# Patient Record
Sex: Male | Born: 1960
Health system: Southern US, Community
[De-identification: ages and names within clinical notes are randomized; demographics above are authoritative.]

## PROBLEM LIST (undated history)

## (undated) DIAGNOSIS — R42 Dizziness and giddiness: Secondary | ICD-10-CM

## (undated) DIAGNOSIS — E559 Vitamin D deficiency, unspecified: Secondary | ICD-10-CM

## (undated) HISTORY — PX: CERVICAL DISC SURGERY: SHX588

---

## 1998-07-02 ENCOUNTER — Emergency Department (HOSPITAL_COMMUNITY): Admission: EM | Admit: 1998-07-02 | Discharge: 1998-07-02 | Payer: Self-pay | Admitting: Emergency Medicine

## 2004-06-01 ENCOUNTER — Emergency Department (HOSPITAL_COMMUNITY): Admission: EM | Admit: 2004-06-01 | Discharge: 2004-06-01 | Payer: Self-pay | Admitting: *Deleted

## 2004-06-08 ENCOUNTER — Ambulatory Visit (HOSPITAL_COMMUNITY): Admission: RE | Admit: 2004-06-08 | Discharge: 2004-06-08 | Payer: Self-pay | Admitting: Family Medicine

## 2011-02-28 ENCOUNTER — Emergency Department (HOSPITAL_COMMUNITY): Payer: 59

## 2011-02-28 ENCOUNTER — Inpatient Hospital Stay (INDEPENDENT_AMBULATORY_CARE_PROVIDER_SITE_OTHER)
Admission: RE | Admit: 2011-02-28 | Discharge: 2011-02-28 | Disposition: A | Discharge status: Home / Self Care | Payer: 59 | Source: Ambulatory Visit | Attending: Family Medicine | Admitting: Family Medicine

## 2011-02-28 ENCOUNTER — Observation Stay (HOSPITAL_COMMUNITY)
Admission: EM | Admit: 2011-02-28 | Discharge: 2011-03-01 | Disposition: A | Discharge status: Home / Self Care | Payer: 59 | Attending: Internal Medicine | Admitting: Internal Medicine

## 2011-02-28 DIAGNOSIS — R079 Chest pain, unspecified: Principal | ICD-10-CM | POA: Insufficient documentation

## 2011-02-28 DIAGNOSIS — F17200 Nicotine dependence, unspecified, uncomplicated: Secondary | ICD-10-CM | POA: Insufficient documentation

## 2011-02-28 DIAGNOSIS — Z8249 Family history of ischemic heart disease and other diseases of the circulatory system: Secondary | ICD-10-CM | POA: Insufficient documentation

## 2011-02-28 LAB — COMPREHENSIVE METABOLIC PANEL
ALT: 24 U/L (ref 0–53)
AST: 21 U/L (ref 0–37)
Albumin: 3.8 g/dL (ref 3.5–5.2)
Alkaline Phosphatase: 77 U/L (ref 39–117)
CO2: 27 mEq/L (ref 19–32)
GFR calc Af Amer: >60 mL/min (ref >60)
Glucose, Bld: 110 mg/dL — ABNORMAL HIGH (ref 70–99)
Potassium: 4.8 mEq/L (ref 3.5–5.1)
Total Bilirubin: 0.5 mg/dL (ref 0.3–1.2)
Total Protein: 7.1 g/dL (ref 6.0–8.3)

## 2011-02-28 LAB — CBC
Hemoglobin: 14.1 g/dL (ref 13.0–17.0)
Platelets: 309 10*3/uL (ref 150–400)
RDW: 12.9 % (ref 11.5–15.5)
WBC: 8.2 10*3/uL (ref 4.0–10.5)

## 2011-02-28 LAB — POCT CARDIAC MARKERS
CKMB, poc: 1.2 ng/mL (ref 1.0–8.0)
Myoglobin, poc: 38.7 ng/mL (ref 12–200)
Troponin i, poc: <0.05 ng/mL (ref 0.00–0.09)

## 2011-02-28 LAB — TROPONIN I: Troponin I: <0.01 ng/mL (ref 0.00–0.06)

## 2011-02-28 LAB — CK TOTAL AND CKMB (NOT AT ARMC)
Relative Index: INVALID (ref 0.0–2.5)
Total CK: 76 U/L (ref 7–232)

## 2011-02-28 LAB — DIFFERENTIAL
Basophils Absolute: 0.1 10*3/uL (ref 0.0–0.1)
Eosinophils Absolute: 0.3 10*3/uL (ref 0.0–0.7)
Lymphocytes Relative: 24 % (ref 12–46)
Lymphs Abs: 2 10*3/uL (ref 0.7–4.0)
Monocytes Absolute: 0.9 10*3/uL (ref 0.1–1.0)
Neutro Abs: 4.9 10*3/uL (ref 1.7–7.7)

## 2011-02-28 LAB — LIPASE, BLOOD: Lipase: 30 U/L (ref 11–59)

## 2011-02-28 LAB — D-DIMER, QUANTITATIVE: D-Dimer, Quant: 0.68 ug/mL-FEU — ABNORMAL HIGH (ref 0.00–0.48)

## 2011-03-01 ENCOUNTER — Encounter (HOSPITAL_COMMUNITY): Payer: Self-pay | Admitting: Radiology

## 2011-03-01 ENCOUNTER — Inpatient Hospital Stay (HOSPITAL_COMMUNITY)
Admission: EM | Admit: 2011-03-01 | Discharge: 2011-03-03 | DRG: 287 | Disposition: A | Discharge status: Home / Self Care | Payer: 59 | Attending: Cardiology | Admitting: Cardiology

## 2011-03-01 DIAGNOSIS — R0789 Other chest pain: Principal | ICD-10-CM | POA: Diagnosis present

## 2011-03-01 DIAGNOSIS — F17200 Nicotine dependence, unspecified, uncomplicated: Secondary | ICD-10-CM | POA: Diagnosis present

## 2011-03-01 DIAGNOSIS — F1910 Other psychoactive substance abuse, uncomplicated: Secondary | ICD-10-CM | POA: Diagnosis present

## 2011-03-01 DIAGNOSIS — Z7982 Long term (current) use of aspirin: Secondary | ICD-10-CM

## 2011-03-01 DIAGNOSIS — Z8249 Family history of ischemic heart disease and other diseases of the circulatory system: Secondary | ICD-10-CM

## 2011-03-01 DIAGNOSIS — R079 Chest pain, unspecified: Secondary | ICD-10-CM

## 2011-03-01 LAB — CARDIAC PANEL(CRET KIN+CKTOT+MB+TROPI)
CK, MB: 1.8 ng/mL (ref 0.3–4.0)
CK, MB: 2 ng/mL (ref 0.3–4.0)
Relative Index: INVALID (ref 0.0–2.5)
Relative Index: INVALID (ref 0.0–2.5)
Total CK: 76 U/L (ref 7–232)
Troponin I: <0.01 ng/mL (ref 0.00–0.06)

## 2011-03-01 LAB — DIFFERENTIAL
Basophils Absolute: 0.1 10*3/uL (ref 0.0–0.1)
Eosinophils Absolute: 0.3 10*3/uL (ref 0.0–0.7)
Lymphocytes Relative: 30 % (ref 12–46)

## 2011-03-01 LAB — POCT I-STAT, CHEM 8
BUN: 13 mg/dL (ref 6–23)
Chloride: 101 mEq/L (ref 96–112)
Creatinine, Ser: 1.1 mg/dL (ref 0.4–1.5)
Glucose, Bld: 117 mg/dL — ABNORMAL HIGH (ref 70–99)
HCT: 41 % (ref 39.0–52.0)
Hemoglobin: 13.9 g/dL (ref 13.0–17.0)
Potassium: 3.7 mEq/L (ref 3.5–5.1)
Sodium: 136 mEq/L (ref 135–145)

## 2011-03-01 LAB — COMPREHENSIVE METABOLIC PANEL
AST: 21 U/L (ref 0–37)
BUN: 12 mg/dL (ref 6–23)
CO2: 29 mEq/L (ref 19–32)
Creatinine, Ser: 0.98 mg/dL (ref 0.4–1.5)
GFR calc non Af Amer: >60 mL/min (ref >60)
Glucose, Bld: 102 mg/dL — ABNORMAL HIGH (ref 70–99)
Sodium: 138 mEq/L (ref 135–145)
Total Bilirubin: 0.3 mg/dL (ref 0.3–1.2)

## 2011-03-01 LAB — CBC
HCT: 39.9 % (ref 39.0–52.0)
HCT: 39.5 % (ref 39.0–52.0)
Hemoglobin: 13.1 g/dL (ref 13.0–17.0)
MCH: 29.2 pg (ref 26.0–34.0)
MCHC: 33.9 g/dL (ref 30.0–36.0)
MCV: 89.2 fL (ref 78.0–100.0)
RDW: 12.9 % (ref 11.5–15.5)

## 2011-03-01 LAB — POCT CARDIAC MARKERS

## 2011-03-01 LAB — RAPID URINE DRUG SCREEN, HOSP PERFORMED
Barbiturates: POSITIVE — AB
Cocaine: POSITIVE — AB

## 2011-03-01 LAB — LIPID PANEL
HDL: 47 mg/dL (ref >39)
Total CHOL/HDL Ratio: 3.3 RATIO
VLDL: 42 mg/dL — ABNORMAL HIGH (ref 0–40)

## 2011-03-01 MED ORDER — IOHEXOL 300 MG/ML  SOLN
100.0000 mL | Freq: Once | INTRAMUSCULAR | Status: AC | PRN
  Administered 2011-03-01: 100 mL via INTRAVENOUS

## 2011-03-02 DIAGNOSIS — I2510 Atherosclerotic heart disease of native coronary artery without angina pectoris: Secondary | ICD-10-CM

## 2011-03-02 LAB — CARDIAC PANEL(CRET KIN+CKTOT+MB+TROPI)
Relative Index: INVALID (ref 0.0–2.5)
Total CK: 57 U/L (ref 7–232)
Troponin I: <0.01 ng/mL (ref 0.00–0.06)

## 2011-03-02 LAB — CBC
HCT: 39.2 % (ref 39.0–52.0)
Hemoglobin: 12.9 g/dL — ABNORMAL LOW (ref 13.0–17.0)
RDW: 12.9 % (ref 11.5–15.5)
WBC: 8.5 10*3/uL (ref 4.0–10.5)

## 2011-03-02 LAB — PROTIME-INR: Prothrombin Time: 13 seconds (ref 11.6–15.2)

## 2011-03-02 LAB — HEPARIN LEVEL (UNFRACTIONATED): Heparin Unfractionated: 0.16 IU/mL — ABNORMAL LOW (ref 0.30–0.70)

## 2011-03-02 LAB — TROPONIN I: Troponin I: <0.01 ng/mL (ref 0.00–0.06)

## 2011-03-03 DIAGNOSIS — R079 Chest pain, unspecified: Secondary | ICD-10-CM

## 2011-03-05 NOTE — H&P (Signed)
NAMESTACY, DESHLER NO.:  [id number]  MEDICAL RECORD NO.:  [id number]           PATIENT TYPE:  E  LOCATION:  [facility]JO                         FACILITY:  [facility]  PHYSICIAN:  Wendi Snipes, MD DATE OF BIRTH:  07-23-1961  DATE OF ADMISSION:  03/01/2011 DATE OF DISCHARGE:                             HISTORY & PHYSICAL   PRIMARY DOCTOR:  Dr. Gerda Diss.  CHIEF COMPLAINT:  Chest pain.  HISTORY OF PRESENT ILLNESS:  This is a 50 year old white male with no past medical history here after short hospital stay for chest pain.  He was recently told he would have a stress test at [facility] on Monday after discharge.  He was ruled out and discharged but when he got home, he noticed that his chest pain worsened.  His chest pain was waxing and waning in nature and occurred at rest, and was described as chest pressure and associated with shortness of breath today.  His symptoms today were more severe than they were during his admission.  He otherwise denies any increased lower extremity edema, palpitations or previous cardiac workup.  PAST MEDICAL HISTORY:  Tobacco abuse.  ALLERGIES:  No known drug allergies.  MEDICATIONS:  None.  SOCIAL HISTORY:  Lives in [place] with his wife.  He is Surveyor, minerals for [employer].  He smokes approximately 1 pack per day.  FAMILY HISTORY:  His father had a myocardial infarction in his 58s.  REVIEW OF SYSTEMS:  All 14 systems were reviewed and were negative except as mentioned in detail in the HPI.  PHYSICAL EXAMINATION:  VITAL SIGNS:  Blood pressure is 92/79, his respiratory rate is 16, pulse is 78, satting 96% on room air. GENERAL:  He is a 50 year old white male appearing stated age, no acute distress. HEENT:  Moist mucous membranes.  Pupils are equal, round and reactive to light and accommodation.  Anicteric sclera. NECK:  No jugular venous distention.  No thyromegaly. CARDIOVASCULAR:  Regular rate and rhythm.  No murmurs,  rubs or gallops. LUNGS:  Clear to auscultation bilaterally. ABDOMEN:  Nontender, nondistended.  Positive bowel sounds.  No masses. EXTREMITIES:  No clubbing, cyanosis, edema. NEUROLOGIC:  Alert and oriented x3.  Cranial nerves II-XII grossly intact.  No focal neurologic deficits. SKIN:  Warm, dry, intact.  No rashes. PSYCH:  Mood and affect are appropriate.  RADIOLOGY:  CTA showed no PE and dependent atelectasis.  EKG showed normal sinus rhythm with a rate of 76 beats per minute with no ST or T- wave abnormalities.  Normal EKG.  LABORATORY FINDINGS:  White blood cell count 7.3, hematocrit is 39.5, potassium is 3.7, creatinine is 1.1.  Troponin is less than 0.05.  ASSESSMENT AND PLAN:  This is a 50 year old white male with a history of smoking and a family history of premature coronary artery disease here with chest pressure at rest. 1. Unstable angina.  We will start heparin drip for the concern that     this represents an acute coronary artery syndrome.  However, there     is no objective evidence of ischemia at this time.  We will start  him on a nitro drip as he is having a mild amount of chest pressure     at this time.  We will perform a noninvasive risk ratification in     the morning if he rules out for myocardial infarction. 2. Tobacco abuse.  Counseled the patient quit smoking immediately.     Wendi Snipes, MD     BHH/MEDQ  D:  03/02/2011  T:  03/02/2011  Job:  [id number]  Electronically Signed by Jim Desanctis MD on 03/05/2011 12:54:50 PM

## 2011-03-11 NOTE — Discharge Summary (Signed)
  NAMEADARSH, MUNDORF                ACCOUNT NO.:  [id number]  MEDICAL RECORD NO.:  [id number]           PATIENT TYPE:  E  LOCATION:  [facility]JO                         FACILITY:  [facility]  PHYSICIAN:  Peggye Pitt, M.D. DATE OF BIRTH:  1961/07/25  DATE OF ADMISSION:  03/01/2011 DATE OF DISCHARGE:  03/01/2011                              DISCHARGE SUMMARY   PRIMARY CARE PHYSICIAN:  Thora Lance, MD  DISCHARGE DIAGNOSIS:  Chest pain, ruled out for acute coronary syndrome, for outpatient stress test by [facility].  DISCHARGE MEDICATIONS:  None.  IMAGES AND PROCEDURES: 1. A chest x-ray on February 28, 2011, that showed acute cardiopulmonary     disease. 2. A CT chest that showed no evidence of PE with some mild dependent     atelectasis in the lungs.  HISTORY AND PHYSICAL:  For complete details, please see dictation by Dr. Susie Cassette on February 28, 2011, but in brief Mr. Broughton is a 50 year old Caucasian gentleman with no significant past medical history who presented to the emergency department for chest pain.  Because of this, we were asked to admit him for further evaluation and management.  HOSPITAL COURSE BY PROBLEM:  Chest pain.  Mr. Schnorr has now ruled out for a code coronary syndrome by the way of two sets of negative cardiac enzymes and EKGs.  He has very positive family history for coronary artery disease including a father who had his first heart attack at age 81.  Because of this, we believe that it is appropriate for him to be stressed.  I have spoken with [facility] and they do not have any room to stress him today, they will however see him in the outpatient setting for an outpatient stress test.  I have spoken with the card master and they will schedule this before the patient leaves the hospital.  VITALS ON DAY OF DISCHARGE:  Blood pressure 103/83, heart rate 73, respirations 16, sats of 100% on room air, and temperature of 97.6.     Peggye Pitt,  M.D.   ______________________________ Peggye Pitt, M.D.    EH/MEDQ  D:  03/01/2011  T:  03/02/2011  Job:  [id number]  cc:   Thora Lance, M.D.  Electronically Signed by Peggye Pitt M.D. on 03/11/2011 05:41:18 PM

## 2011-03-12 NOTE — Discharge Summary (Signed)
NAMEJOSHUWA, VECCHIO                ACCOUNT NO.:  [id number]  MEDICAL RECORD NO.:  [id number]           PATIENT TYPE:  I  LOCATION:  6526                         FACILITY:  [facility]  PHYSICIAN:  Mosella Kasa C. Rontavious Albright, MD, FACCDATE OF BIRTH:  Jun 18, 1961  DATE OF ADMISSION:  03/01/2011 DATE OF DISCHARGE:  03/03/2011                              DISCHARGE SUMMARY   PRIMARY CARE PHYSICIAN:  Dr. Gerda Diss.  DISCHARGE DIAGNOSES: 1. Noncardiac chest pain.     a.     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  Recommended      tobacco cessation, cessation of illicit substances, and follow up      with PCP with likely eventual GI workup. 2. Polysubstance abuse (ongoing tobacco abuse, UDS positive for     opiates, cocaine, and barbiturates).  SECONDARY DIAGNOSES:  Tobacco abuse as above.  ALLERGIES:  NKDA.  PROCEDURES: 1. EKG, March 01, 2011.  NSR, 76 bpm, no significant ST-T-wave changes. 2. CT angiography of the chest March 01, 2011:  No evidence of     pulmonary embolus.  Dependent atelectasis in the lungs. 3. Cardiac catheterization March 02, 2011.  Please see discharge     diagnoses 1(a).  HISTORY OF PRESENT ILLNESS:  Mr. Faulk is a 50 year old gentleman with the above-noted medical history which should be noted also includes a strong family history of premature CAD who presented with recurrent chest discomfort that was increasing in severity.  Eventual workup in ED revealed positive UDS and thus far cardiac enzymes negative as well as nonacute EKG.  However, due to increasing severity of symptoms and significant risk factors given ongoing tobacco abuse and positive family history, the patient eventually set up for diagnostic  cardiac catheterization.  HOSPITAL COURSE:  The patient was admitted and underwent procedures as described above.  He tolerated them well without any significant complications.  Due to the minimal nonobstructive CAD and normal LV systolic function, the patient was instructed to quit smoking, quit all illicit substances, follow up with the primary care provider with anticipation of likely GI workup in the future.  The patient was seen in the morning of March 03, 2011, on the day following his cardiac catheterization and deemed stable for discharge.  He was given his medication list essentially unchanged, followup instructions and postcath instructions.  All questions and concerns were addressed prior to leaving the hospital.  DISCHARGE LABORATORY FINDINGS:  WBC is 8.5, HGB 12.9, HCT 39.2, PLT count is 289.  WBC differential on admission was within normal limits. Protime 13.0, INR 0.96.  Sodium 136, potassium 3.7, chloride 101, bicarb 29, BUN 13, creatinine 1.1, glucose ranged from 102 to 117, hemoglobin A1c 5.9% with mean plasma glucose of 123.  The patient had 5 full sets of cardiac enzymes that were negative.  He also had a set  of point-of- care markers that was negative.  Total cholesterol 157, triglycerides 208, HDL 47, LDL 68, total cholesterol/HDL ratio 3.3.  Urine drug screen positive for barbiturates, cocaine and opiates.  FOLLOWUP PLAN AND APPOINTMENT:  Please see hospital course.  DISCHARGE MEDICATIONS: 1. Acetaminophen 325 mg 1-2 tablets p.o. q.4 h. p.r.n. 2. Enteric-coated aspirin 81 mg p.o. daily.  Duration of discharge encounter including physician time was 31 minutes.     Jarrett Ables, PAC   ______________________________ Jesse Sans. Daleen Squibb, MD, [facility]    MS/MEDQ  D:  03/03/2011  T:  03/04/2011  Job:  [id number]  cc:   Dr. Gerda Diss  Electronically Signed by Jarrett Ables PAC on 03/04/2011 12:59:41 PM Electronically Signed by Valera Castle MD [facility] on 03/12/2011  09:17:39 AM

## 2011-03-25 NOTE — Procedures (Signed)
Louis Griffin, Louis Griffin                ACCOUNT NO.:  [id number]  MEDICAL RECORD NO.:  [id number]          PATIENT TYPE:  LOCATION:                                 FACILITY:  PHYSICIAN:  Bevelyn Buckles. Sophonie Goforth, MDDATE OF BIRTH:  1961-04-29  DATE OF PROCEDURE:  03/02/2011 DATE OF DISCHARGE:                           CARDIAC CATHETERIZATION   DATE OF PROCEDURE:  March 02, 2011.  PRIMARY CARE PHYSICIAN:  Scott A. Gerda Diss, MD.  Louis Griffin is a 50 year old male with a history of tobacco use and a strong family history of coronary artery disease, denies any history of hypertension or hyperlipidemia.  He is admitted with chest pressure. Workup revealed a positive urine drug screen for cocaine.  His cardiac enzymes and EKG were normal.  Given the concerning nature of his pain, he was referred for cardiac catheterization.  PROCEDURES PERFORMED: 1. Selective coronary angiography. 2. Left heart cath. 3. Left ventriculogram.  DESCRIPTION OF PROCEDURE:  The risk and indication were explained. Consent was signed and placed on the chart.  The right Allen's test was abnormal, so we then checked an Allen's test on the left wrist, this was normal.  The left wrist area was then prepped and draped in routine sterile fashion, anesthetized with 1% local lidocaine.  A 5-French arterial sheath was placed using a modified Seldinger technique. Standard catheters including a JL-3.5, JR-4, and angled pigtail were used.  All catheter exchanges were made over wire.  There were no apparent complication.  He did receive 3 mg of intra-arterial verapamil and 4500 units of systemic heparin.  Central aortic pressure was 106/79 with a mean of 93.  LV pressure 101/10 with EDP of 12.  There is no aortic stenosis.  Left main was normal.  LAD was a long vessel coursing through the apex.  It gave off a single diagonal.  In the mid LAD, there was long section of myocardial bridging.  There was evidence of systolic  compression but normal diastolic flow.  Remainder of the LAD was normal.  Left circumflex gave off small to moderate OM-1, a large OM-2, and a small posterolateral.  There is a 30% lesion in the ostial circumflex, a 30 to 40% lesion in the mid AV groove circumflex, and a 20% lesion in the OM-2.  Right coronary artery was a dominant vessel, it gave off an RV branch, a large acute marginal which fed the PDA territory several small posterolaterals.  It was angiographically normal.  Left ventriculogram done in the RAO position showed an EF of 55%. Initially, I thought there may be a little bit of apical hypokinesis, but I think this was likely a PVC.  Overall, wall motion seemed to be normal.  There is no significant mitral regurgitation.  ASSESSMENT: 1. Minimal nonobstructive coronary artery disease. 2. 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 3. Normal left ventricular function.  Plan will be for medical therapy.  I stressed the need for risk factor reduction including avoidance of tobacco use and illicit substances.  If this pain continues, we would recommend a  GI workup.     Bevelyn Buckles. Duvall Comes, MD     DRB/MEDQ  D:  03/02/2011  T:  03/03/2011  Job:  [id number]  cc:   Lorin Picket A. Gerda Diss, MD  Electronically Signed by Arvilla Meres MD on 03/25/2011 06:45:14 PM

## 2011-04-03 NOTE — H&P (Signed)
Louis Griffin, Louis Griffin                ACCOUNT NO.:  [id number]  MEDICAL RECORD NO.:  [id number]           PATIENT TYPE:  E  LOCATION:  [facility]                         FACILITY:  [facility]  PHYSICIAN:  Richarda Overlie, MD       DATE OF BIRTH:  1961/04/30  DATE OF ADMISSION:  02/28/2011 DATE OF DISCHARGE:                             HISTORY & PHYSICAL   PRIMARY CARE PHYSICIAN:  Thora Lance, MD; however, the patient states that Dr. Kirby Funk is not his primary care physician and he has seen him only for acute issues.  Therefore, the patient is being admitted under [facility] team 3.  CHIEF COMPLAINT:  Chest pain.  SUBJECTIVE:  This is a 50 year old male who presents to the ED with a chief complaint of chest pain that started around 11:30 this morning. The chest pain is described as chest tightness, substernal in location, sometimes described as chest tightness, and occasionally described as being a burning sensation, each episode lasting 10-15 minutes, not particularly related to exertion.  In fact, the patient states that it may have improved with ambulation.  It is not particularly pleuritic and is not worse when he takes in a deep breath.  He has never experienced this kind of pain before.  He does not have any history of coronary artery disease.  He denies any fever, chills, and rigors in the last few days.  He has an occasional cough which is worse when he is in a recumbent position and worse at night and in the morning.  However, the patient denies any orthopnea, paroxysmal nocturnal dyspnea, or dependent edema.  He has no recent history of travel.  He denies any nausea, vomiting, abdominal pain, or any other symptoms at this time.  REVIEW OF SYSTEMS:  Complete review of systems was done as documented in HPI.  PAST MEDICAL HISTORY:  None.  FAMILY HISTORY:  Father had heart attack at the age of 91-44.  He also has a history of CABG and stent placement.  He also has dyslipidemia  and hypertension.  He is 50 and alive and otherwise doing fine. Hypertension also runs on his mother's side of the family but his mother is healthy.  One brother has coronary artery disease and one systems who is 49 years old who is healthy.  SOCIAL HISTORY:  The patient smokes a pack a day.  Drinks alcohol at least twice a week.  Works as Teacher, English as a foreign language.  He has three children who are healthy.  ALLERGIES:  No known drug allergies.  HOME MEDICATIONS:  Currently none.  REVIEW OF SYSTEMS:  Complete review of systems was done as documented in HPI.  PHYSICAL EXAMINATION:  VITAL SIGNS:  Blood pressure 130/84, pulse 76, respirations 18, temperature 98.1. GENERAL:  Comfortable currently in no acute cardiopulmonary distress. HEENT:  Normocephalic, atraumatic.  Pupils are equal and reactive. Extraocular movements intact. NECK:  Supple.  No JVD. LUNGS:  Clear to auscultation bilaterally.  No wheezes or crackles or rhonchi. ABDOMEN:  Soft, nontender, nondistended. EXTREMITIES:  Without cyanosis, clubbing, or edema. NEUROLOGIC:  Cranial nerves II through XII appear to  be grossly intact. SKIN:  Without any skin rashes. PSYCHIATRIC:  Appropriate mood and affect.  LABORATORY DATA:  EKG shows normal sinus rhythm without any ST-T segment changes at a rate of 75 beats per minute.  X-ray shows no acute cardiopulmonary disease.  Lipase is 30.  Sodium 135, potassium 4.8, chloride 101, bicarb 27, glucose 110, BUN 13, creatinine 0.92, calcium 9.5, total protein 7.1, albumin 3.8.  AST 21, ALT 24, alk phos 77, T- Bili 0.5.  WBC 8.2, hemoglobin 14.1, hematocrit 42.2, platelet count of 309,000.  ASSESSMENT AND PLAN: 1. Chest pain was fairly atypical features, doubt this is anginal     pain. 2. Rule out pulmonary embolism. 3. Nicotine dependence. 4. Strong family history of coronary artery disease.  PLAN:  The patient will be admitted to this telemetry floor.  I have spoken with [facility] and they are in agreement that the patient would warrant a stress test in the morning.  We will schedule him for a Lexiscan Myoview in the morning.  We will start him on aspirin, beta-blocker, and nitro paste overnight.  We will cycle his cardiac enzymes, obtain serial EKGs.  If the patient's stress test is negative, then he may be discharged home in the morning.  We will also obtain a stat D-dimer.  If elevated, we will obtain a CT angio of the chest to rule out a PE.  He is a full code.     Richarda Overlie, MD     NA/MEDQ  D:  02/28/2011  T:  02/28/2011  Job:  [id number]  Electronically Signed by Richarda Overlie MD on 04/03/2011 08:32:24 PM

## 2013-03-02 ENCOUNTER — Emergency Department (INDEPENDENT_AMBULATORY_CARE_PROVIDER_SITE_OTHER)
Admission: EM | Admit: 2013-03-02 | Discharge: 2013-03-02 | Disposition: A | Discharge status: Home / Self Care | Payer: 59 | Source: Home / Self Care | Attending: Emergency Medicine | Admitting: Emergency Medicine

## 2013-03-02 ENCOUNTER — Encounter (HOSPITAL_COMMUNITY): Payer: Self-pay | Admitting: Emergency Medicine

## 2013-03-02 MED ORDER — CEPHALEXIN 500 MG PO CAPS: 500.0000 mg | ORAL_CAPSULE | Freq: Four times a day (QID) | ORAL | Status: DC

## 2013-03-02 NOTE — ED Provider Notes (Signed)
History     CSN: [id number]  Arrival date & time 03/02/13  1323   First MD Initiated Contact with Patient 03/02/13 1403      Chief Complaint  Patient presents with  . Wrist Pain    (Consider location/radiation/quality/duration/timing/severity/associated sxs/prior treatment) HPI Comments: Pt woke up yesterday morning with swelling and pain in R wrist.  Thinks something must have bitten him. Initially was small raised area, now swelling has spread  Patient is a 52 y.o. male presenting with wrist pain. The history is provided by the patient.  Wrist Pain This is a new problem. The current episode started yesterday. The problem occurs constantly. The problem has been gradually worsening. Exacerbated by: touching swollen area. Nothing relieves the symptoms. Treatments tried: transient improvement on ibuprofen.    History reviewed. No pertinent past medical history.  History reviewed. No pertinent past surgical history.  History reviewed. No pertinent family history.  History  Substance Use Topics  . Smoking status: Current Every Day Smoker -- 0.50 packs/day    Types: Cigarettes  . Smokeless tobacco: Not on file  . Alcohol Use: Yes      Review of Systems  Constitutional: Negative for fever and chills.  Musculoskeletal:       R wrist swelling and redness  Skin: Positive for color change. Negative for wound.    Allergies  Review of patient's allergies indicates no known allergies.  Home Medications   Current Outpatient Rx  Name  Route  Sig  Dispense  Refill  . cephALEXin (KEFLEX) 500 MG capsule   Oral   Take 1 capsule (500 mg total) by mouth 4 (four) times daily.   28 capsule   0     BP 138/89  Pulse 89  Temp(Src) 98.1 F (36.7 C) (Oral)  Resp 16  SpO2 97%  Physical Exam  Constitutional: He appears well-developed and well-nourished. No distress.  Musculoskeletal:       Right wrist: He exhibits tenderness and swelling. He exhibits normal range of motion.      Hands: Skin: Skin is warm, dry and intact. No rash noted. There is erythema.  No obvious bite/puncture mark on skin from bite. Warmth and tenderness to palp R wrist. See MSK exam.     ED Course  Procedures (including critical care time)  Labs Reviewed - No data to display No results found.   1. Insect bite of wrist with local reaction, left, initial encounter       MDM  Local reaction vs cellulitis.  No proof of insect bite but sx c/w bite reaction. Will tx as if insect bite with reaction at risk for secondary infection.         Cathlyn Parsons, NP 03/02/13 1415

## 2013-03-02 NOTE — ED Notes (Signed)
Pt is here for swollen right wrist onset yest am Sx include: redness, pain w/activity and swelling Denies: inj/trauma, f/v/n/d Believes he might have gotten bit by an insect??? Took x3 200mg  of ibuprofen this am  He is alert and oriented w/no signs of acute distress.

## 2013-03-02 NOTE — ED Provider Notes (Signed)
Medical screening examination/treatment/procedure(s) were performed by non-physician practitioner and as supervising physician I was immediately available for consultation/collaboration.  Sharyah Bostwick, M.D.  Samie Barclift C Drianna Chandran, MD 03/02/13 2012 

## 2013-03-02 NOTE — ED Notes (Signed)
Pt has been discharged and signed paper due to Epic down

## 2015-05-16 ENCOUNTER — Emergency Department (INDEPENDENT_AMBULATORY_CARE_PROVIDER_SITE_OTHER)
Admission: EM | Admit: 2015-05-16 | Discharge: 2015-05-16 | Disposition: A | Discharge status: Home / Self Care | Payer: 59 | Source: Home / Self Care | Attending: Family Medicine | Admitting: Family Medicine

## 2015-05-16 ENCOUNTER — Encounter (HOSPITAL_COMMUNITY): Payer: Self-pay | Admitting: Emergency Medicine

## 2015-05-16 DIAGNOSIS — H5789 Other specified disorders of eye and adnexa: Secondary | ICD-10-CM

## 2015-05-16 DIAGNOSIS — H578 Other specified disorders of eye and adnexa: Secondary | ICD-10-CM

## 2015-05-16 MED ORDER — TETRACAINE HCL 0.5 % OP SOLN
OPHTHALMIC | Status: AC
  Filled 2015-05-16: qty 2

## 2015-05-16 MED ORDER — ERYTHROMYCIN 5 MG/GM OP OINT: TOPICAL_OINTMENT | OPHTHALMIC | Status: DC

## 2015-05-16 NOTE — ED Provider Notes (Signed)
Louis Griffin is a 54 y.o. male who presents to Urgent Care today for left eye irritation. Patient has irritation itching and a foreign body sensation in his left eye. This is associated with eye redness. Symptoms started today and are stable. He denies any significant blurriness. No fevers or chills. He cannot recall any specific incident a foreign body exposure. He's tried irrigating the eye which did not help much.   History reviewed. No pertinent past medical history. History reviewed. No pertinent past surgical history. History  Substance Use Topics  . Smoking status: Current Every Day Smoker -- 0.50 packs/day    Types: Cigarettes  . Smokeless tobacco: Not on file  . Alcohol Use: Yes   ROS as above Medications: No current facility-administered medications for this encounter.   Current Outpatient Prescriptions  Medication Sig Dispense Refill  . cephALEXin (KEFLEX) 500 MG capsule Take 1 capsule (500 mg total) by mouth 4 (four) times daily. 28 capsule 0   No Known Allergies   Exam:  BP 125/80 mmHg  Pulse 84  Temp(Src) 97.3 F (36.3 C) (Oral)  Resp 16  SpO2 95% Gen: Well NAD HEENT: EOMI,  MMM conjunctiva injection on the left normal on the right. PERRLA bilaterally. No fluorescein uptake on the cornea. The Tono-Pen is nonfunctioning this evening.  No foreign body visible with detailed exam. Lungs: Normal work of breathing. CTABL Heart: RRR no MRG Abd: NABS, Soft. Nondistended, Nontender Exts: Brisk capillary refill, warm and well perfused.   Visual Acuity:  Right Eye Distance: 20/20-2 Left Eye Distance: 20/20-2 Bilateral Distance: 20/25   No results found for this or any previous visit (from the past 24 hour(s)). No results found.  Assessment and Plan: 54 y.o. male with left eye irritation. I suspect he has allergic conjunctivitis. Treat with Zaditor eyedrops and Zyrtec. Use erythromycin ointment and follow-up with ophthalmology.  Discussed warning signs or  symptoms. Please see discharge instructions. Patient expresses understanding.     Gregor Hams, MD 05/16/15 2032

## 2015-05-16 NOTE — Discharge Instructions (Signed)
Thank you for coming in today. Use over-the-counter Zaditor eyedrops (Ketotifen) Use over-the-counter Zyrtec (cetirizine)  Use Systane artificial tears as needed Use the eye ointment.  Follow up with Dr. Katy Fitch.  Return as needed.   Allergic Conjunctivitis The conjunctiva is a thin membrane that covers the visible white part of the eyeball and the underside of the eyelids. This membrane protects and lubricates the eye. The membrane has small blood vessels running through it that can normally be seen. When the conjunctiva becomes inflamed, the condition is called conjunctivitis. In response to the inflammation, the conjunctival blood vessels become swollen. The swelling results in redness in the normally white part of the eye. The blood vessels of this membrane also react when a person has allergies and is then called allergic conjunctivitis. This condition usually lasts for as long as the allergy persists. Allergic conjunctivitis cannot be passed to another person (non-contagious). The likelihood of bacterial infection is great and the cause is not likely due to allergies if the inflamed eye has:  A sticky discharge.  Discharge or sticking together of the lids in the morning.  Scaling or flaking of the eyelids where the eyelashes come out.  Red swollen eyelids. CAUSES   Viruses.  Irritants such as foreign bodies.  Chemicals.  General allergic reactions.  Inflammation or serious diseases in the inside or the outside of the eye or the orbit (the boney cavity in which the eye sits) can cause a "red eye." SYMPTOMS   Eye redness.  Tearing.  Itchy eyes.  Burning feeling in the eyes.  Clear drainage from the eye.  Allergic reaction due to pollens or ragweed sensitivity. Seasonal allergic conjunctivitis is frequent in the spring when pollens are in the air and in the fall. DIAGNOSIS  This condition, in its many forms, is usually diagnosed based on the history and an  ophthalmological exam. It usually involves both eyes. If your eyes react at the same time every year, allergies may be the cause. While most "red eyes" are due to allergy or an infection, the role of an eye (ophthalmological) exam is important. The exam can rule out serious diseases of the eye or orbit. TREATMENT   Non-antibiotic eye drops, ointments, or medications by mouth may be prescribed if the ophthalmologist is sure the conjunctivitis is due to allergies alone.  Over-the-counter drops and ointments for allergic symptoms should be used only after other causes of conjunctivitis have been ruled out, or as your caregiver suggests. Medications by mouth are often prescribed if other allergy-related symptoms are present. If the ophthalmologist is sure that the conjunctivitis is due to allergies alone, treatment is normally limited to drops or ointments to reduce itching and burning. HOME CARE INSTRUCTIONS   Wash hands before and after applying drops or ointments, or touching the inflamed eye(s) or eyelids.  Do not let the eye dropper tip or ointment tube touch the eyelid when putting medicine in your eye.  Stop using your soft contact lenses and throw them away. Use a new pair of lenses when recovery is complete. You should run through sterilizing cycles at least three times before use after complete recovery if the old soft contact lenses are to be used. Hard contact lenses should be stopped. They need to be thoroughly sterilized before use after recovery.  Itching and burning eyes due to allergies is often relieved by using a cool cloth applied to closed eye(s). SEEK MEDICAL CARE IF:   Your problems do not go away after  two or three days of treatment.  Your lids are sticky (especially in the morning when you wake up) or stick together.  Discharge develops. Antibiotics may be needed either as drops, ointment, or by mouth.  You have extreme light sensitivity.  An oral temperature above 102  F (38.9 C) develops.  Pain in or around the eye or any other visual symptom develops. MAKE SURE YOU:   Understand these instructions.  Will watch your condition.  Will get help right away if you are not doing well or get worse. Document Released: 03/05/2003 Document Revised: 03/06/2012 Document Reviewed: 01/29/2008 [facility] Patient Information 2015 [place], [place]. This information is not intended to replace advice given to you by your health care provider. Make sure you discuss any questions you have with your health care provider.

## 2015-05-16 NOTE — ED Notes (Signed)
C/o left eye pain/irritation onset earlier today; denies inj/trauma Sx also include redness, watery and blurry vision of the left eye Alert, no signs of acute distress.

## 2018-06-09 DIAGNOSIS — R509 Fever, unspecified: Secondary | ICD-10-CM | POA: Diagnosis not present

## 2018-09-04 DIAGNOSIS — N509 Disorder of male genital organs, unspecified: Secondary | ICD-10-CM | POA: Diagnosis not present

## 2018-10-03 ENCOUNTER — Other Ambulatory Visit: Payer: Self-pay | Admitting: Urology

## 2018-10-03 DIAGNOSIS — Z125 Encounter for screening for malignant neoplasm of prostate: Secondary | ICD-10-CM

## 2018-10-03 DIAGNOSIS — I861 Scrotal varices: Secondary | ICD-10-CM | POA: Diagnosis not present

## 2018-10-03 DIAGNOSIS — N401 Enlarged prostate with lower urinary tract symptoms: Secondary | ICD-10-CM | POA: Diagnosis not present

## 2018-10-03 DIAGNOSIS — R3912 Poor urinary stream: Secondary | ICD-10-CM | POA: Diagnosis not present

## 2018-10-11 ENCOUNTER — Other Ambulatory Visit: Payer: Self-pay | Admitting: Urology

## 2018-10-11 DIAGNOSIS — I861 Scrotal varices: Secondary | ICD-10-CM

## 2018-10-13 ENCOUNTER — Ambulatory Visit
Admission: RE | Admit: 2018-10-13 | Discharge: 2018-10-13 | Disposition: A | Discharge status: Home / Self Care | Payer: 59 | Source: Ambulatory Visit | Attending: Urology | Admitting: Urology

## 2018-10-13 DIAGNOSIS — I861 Scrotal varices: Secondary | ICD-10-CM

## 2018-10-13 DIAGNOSIS — N503 Cyst of epididymis: Secondary | ICD-10-CM | POA: Diagnosis not present

## 2018-10-20 DIAGNOSIS — Z23 Encounter for immunization: Secondary | ICD-10-CM | POA: Diagnosis not present

## 2018-10-20 DIAGNOSIS — Z1321 Encounter for screening for nutritional disorder: Secondary | ICD-10-CM | POA: Diagnosis not present

## 2018-10-20 DIAGNOSIS — D696 Thrombocytopenia, unspecified: Secondary | ICD-10-CM | POA: Diagnosis not present

## 2018-10-20 DIAGNOSIS — Z131 Encounter for screening for diabetes mellitus: Secondary | ICD-10-CM | POA: Diagnosis not present

## 2018-10-20 DIAGNOSIS — R062 Wheezing: Secondary | ICD-10-CM | POA: Diagnosis not present

## 2018-10-20 DIAGNOSIS — Z0001 Encounter for general adult medical examination with abnormal findings: Secondary | ICD-10-CM | POA: Diagnosis not present

## 2018-10-20 DIAGNOSIS — F17200 Nicotine dependence, unspecified, uncomplicated: Secondary | ICD-10-CM | POA: Diagnosis not present

## 2019-01-05 DIAGNOSIS — K921 Melena: Secondary | ICD-10-CM | POA: Diagnosis not present

## 2019-01-17 DIAGNOSIS — D123 Benign neoplasm of transverse colon: Secondary | ICD-10-CM | POA: Diagnosis not present

## 2019-01-17 DIAGNOSIS — K621 Rectal polyp: Secondary | ICD-10-CM | POA: Diagnosis not present

## 2019-01-17 DIAGNOSIS — K921 Melena: Secondary | ICD-10-CM | POA: Diagnosis not present

## 2019-02-23 DIAGNOSIS — E785 Hyperlipidemia, unspecified: Secondary | ICD-10-CM | POA: Diagnosis not present

## 2019-02-23 DIAGNOSIS — E663 Overweight: Secondary | ICD-10-CM | POA: Diagnosis not present

## 2019-02-23 DIAGNOSIS — E559 Vitamin D deficiency, unspecified: Secondary | ICD-10-CM | POA: Diagnosis not present

## 2019-03-02 DIAGNOSIS — E559 Vitamin D deficiency, unspecified: Secondary | ICD-10-CM | POA: Diagnosis not present

## 2019-03-02 DIAGNOSIS — E782 Mixed hyperlipidemia: Secondary | ICD-10-CM | POA: Diagnosis not present

## 2019-05-06 IMAGING — US US SCROTUM W/ DOPPLER COMPLETE
1 series · 14 of 25 positions shown · non-contrast
Comparison: None.

CLINICAL DATA: Bilateral testicular swelling.

EXAM:
SCROTAL ULTRASOUND
DOPPLER ULTRASOUND OF THE TESTICLES
TECHNIQUE: Complete ultrasound examination of the testicles, epididymis, and
other scrotal structures was performed. Color and spectral Doppler
ultrasound were also utilized to evaluate blood flow to the
testicles.

[Series 1: us scrotum w/ doppler complete · 0.07mm/px · 14 of 45 slices shown]
[im 1/45]
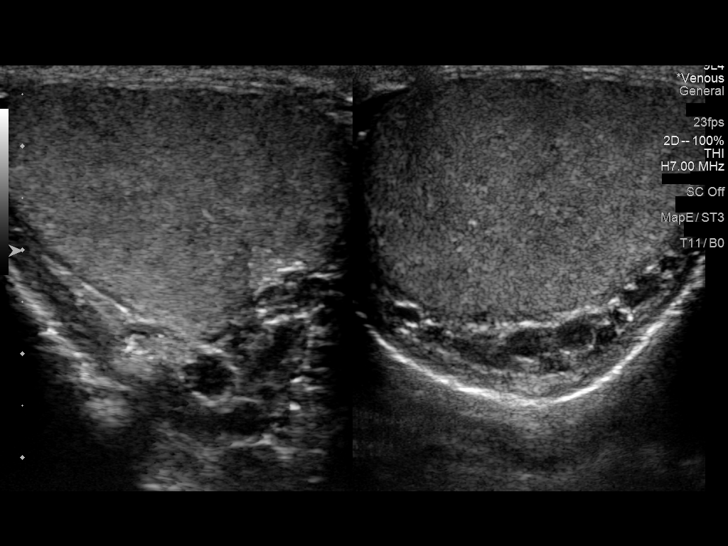
[im 4/45]
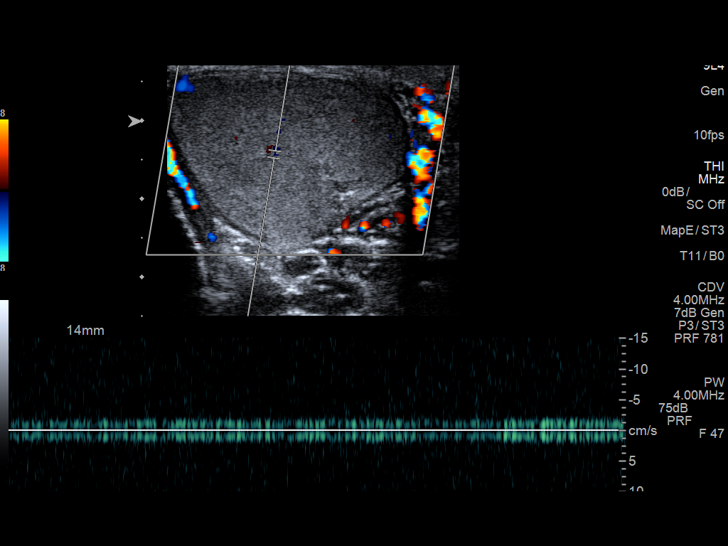
[im 8/45]
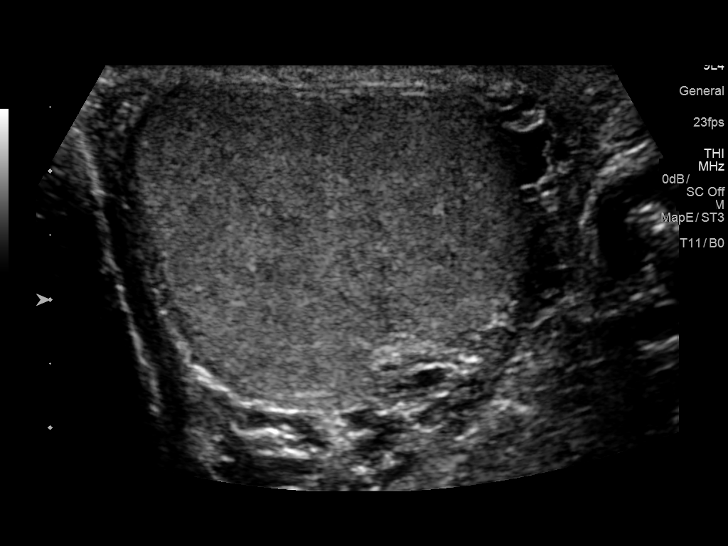
[im 12/45]
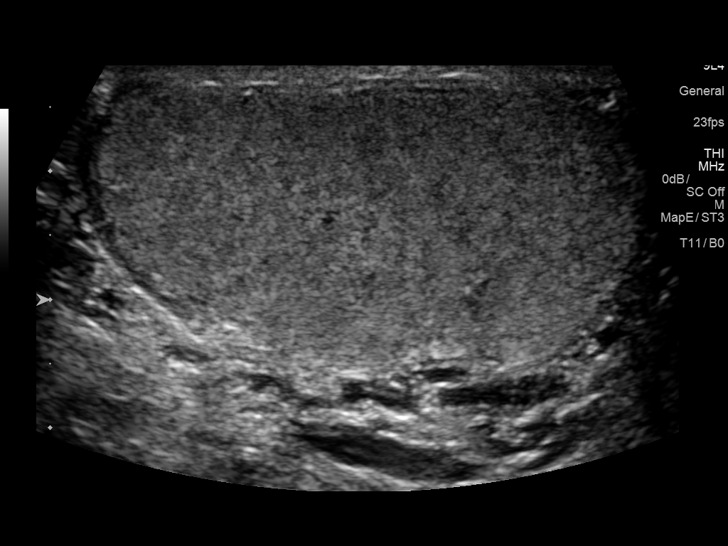
[im 15/45]
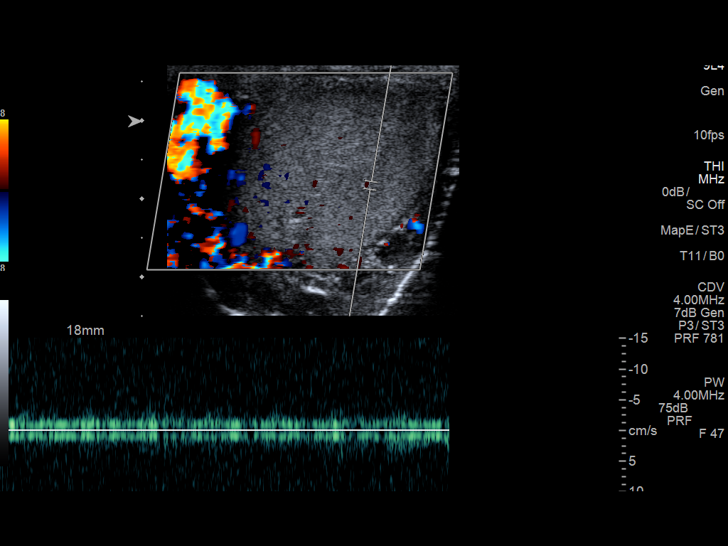
[im 17/45]
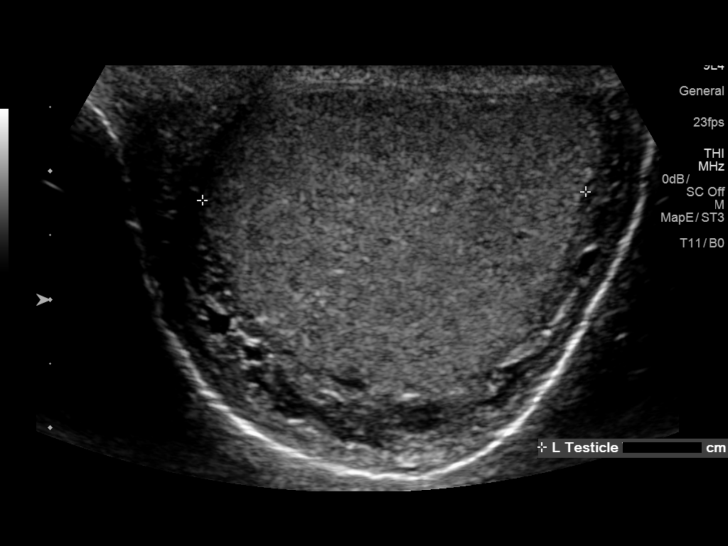
[im 21/45]
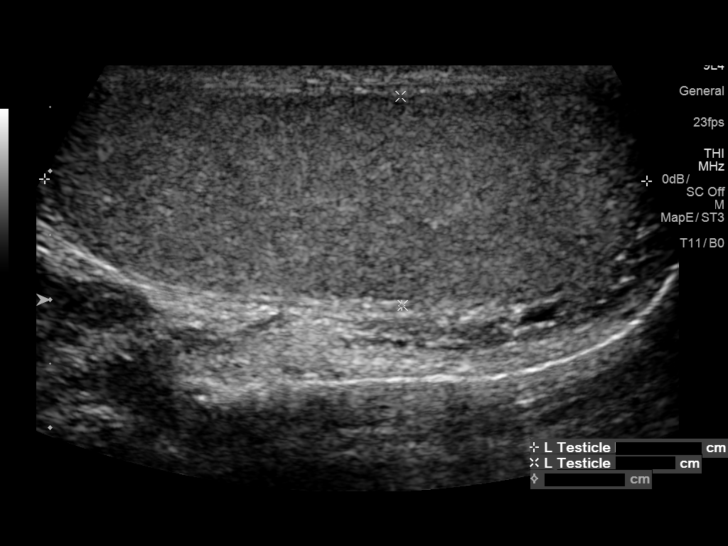
[im 24/45]
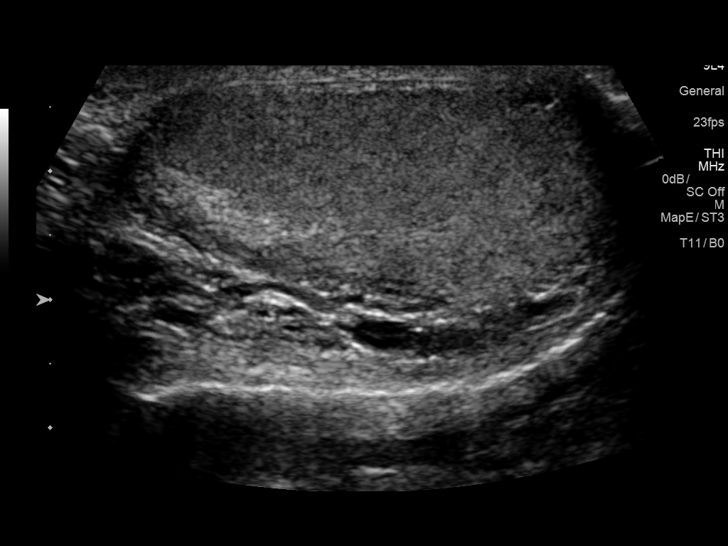
[im 28/45]
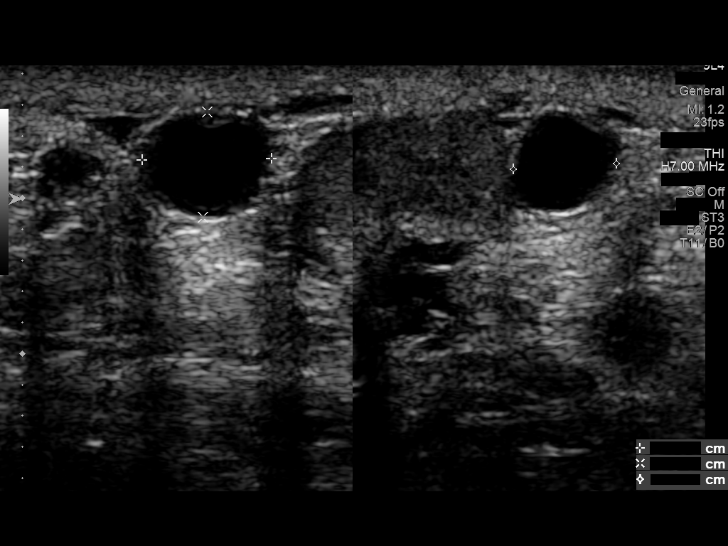
[im 30/45]
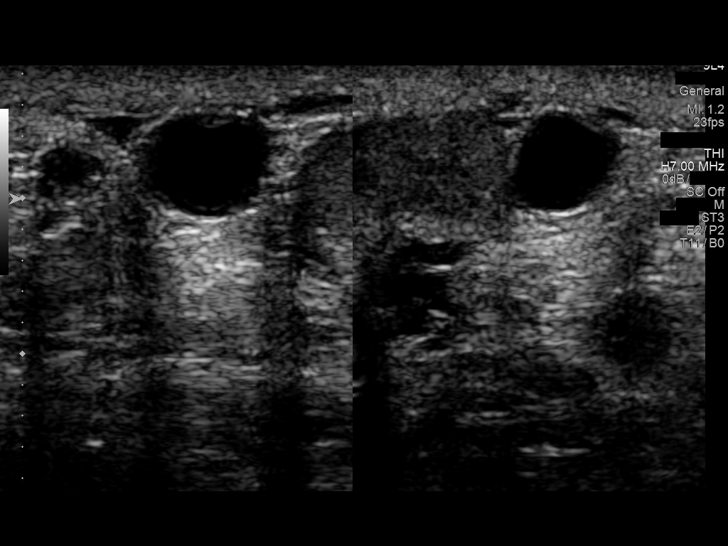
[im 34/45]
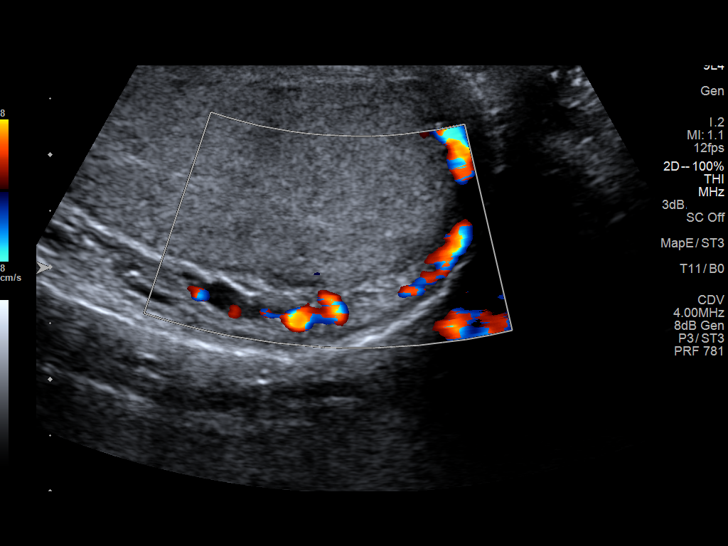
[im 37/45]
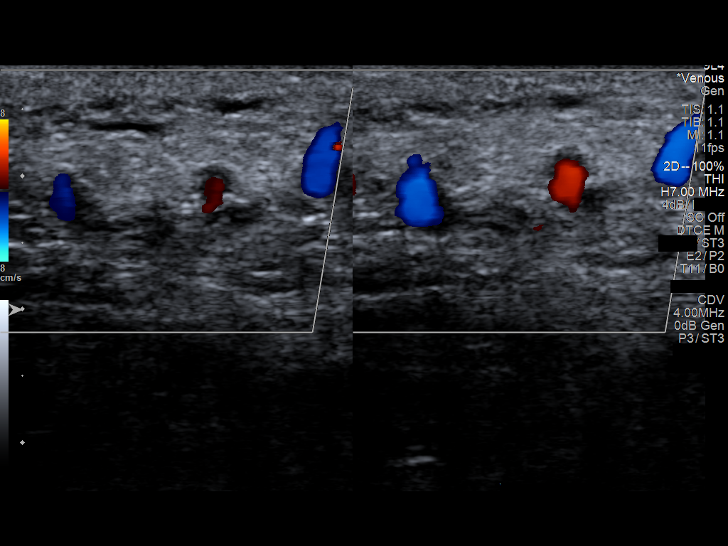
[im 41/45]
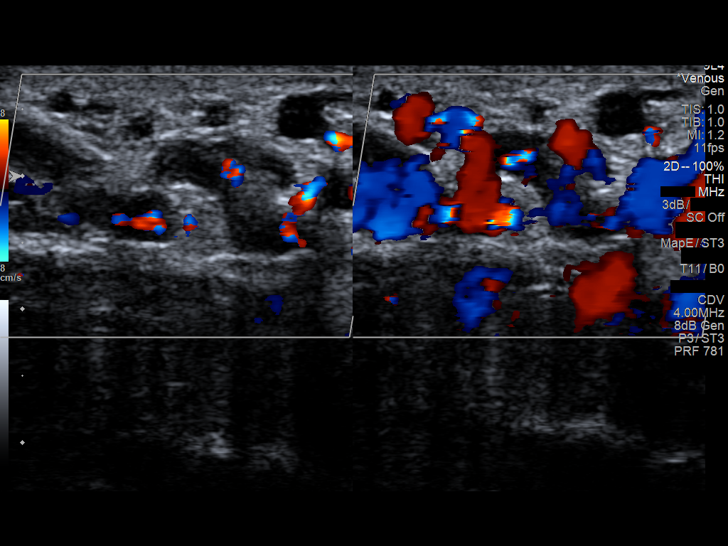
[im 45/45]
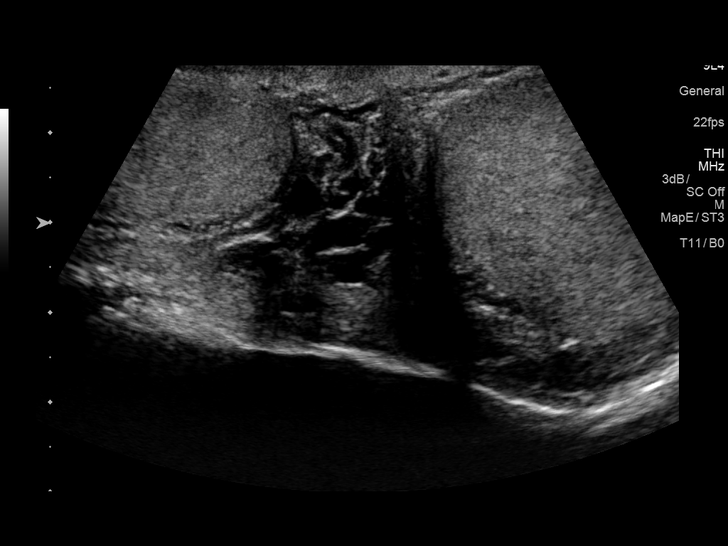

[14 of 25 positions shown; findings below may reference images not displayed]

FINDINGS: Right testicle

Measurements: 4.3 x 2.2 x 3.0 cm. No mass or microlithiasis
visualized.

Left testicle

Measurements: 4.7 x 1.6 x 3.0 cm. No mass or microlithiasis
visualized.

Right epididymis: Normal in size and appearance. 8 mm
benign-appearing cysts incidentally noted.

Left epididymis:  Normal in size and appearance.

Hydrocele:  None visualized.

Varicocele:  None visualized.

Pulsed Doppler interrogation of both testes demonstrates normal low
resistance arterial and venous waveforms bilaterally.
IMPRESSION: Normal testicular ultrasound.

## 2019-09-26 ENCOUNTER — Encounter (HOSPITAL_COMMUNITY): Payer: Self-pay

## 2019-09-26 ENCOUNTER — Ambulatory Visit (HOSPITAL_COMMUNITY)
Admission: EM | Admit: 2019-09-26 | Discharge: 2019-09-26 | Disposition: A | Discharge status: Home / Self Care | Payer: 59 | Attending: Emergency Medicine | Admitting: Emergency Medicine

## 2019-09-26 ENCOUNTER — Other Ambulatory Visit: Payer: Self-pay

## 2019-09-26 DIAGNOSIS — R030 Elevated blood-pressure reading, without diagnosis of hypertension: Secondary | ICD-10-CM

## 2019-09-26 DIAGNOSIS — M25551 Pain in right hip: Secondary | ICD-10-CM | POA: Diagnosis not present

## 2019-09-26 MED ORDER — IBUPROFEN 800 MG PO TABS: 800.0000 mg | ORAL_TABLET | Freq: Three times a day (TID) | ORAL | 0 refills | Status: DC | PRN

## 2019-09-26 MED ORDER — CYCLOBENZAPRINE HCL 10 MG PO TABS: 10.0000 mg | ORAL_TABLET | Freq: Two times a day (BID) | ORAL | 0 refills | Status: DC | PRN

## 2019-09-26 NOTE — ED Provider Notes (Signed)
[place]    CSN: [id number] Arrival date & time: 09/26/19  1004      History   Chief Complaint Chief Complaint  Patient presents with  . Hip Pain    HPI Louis Griffin is a 58 y.o. male.   Patient presents with a 5-day history of right hip pain.  No injury or falls.  The pain is worse with stairs and walking; no alleviating factors; radiates to posterior thigh.  Treatment attempted at home with ibuprofen, Aleve, Tylenol which did not help.  Patient was seen by his PCP on 09/14/2019 and treated for bronchitis with doxycycline and prednisone.  The history is provided by the patient.    History reviewed. No pertinent past medical history.  There are no active problems to display for this patient.   History reviewed. No pertinent surgical history.     Home Medications    Prior to Admission medications   Medication Sig Start Date End Date Taking? Authorizing Provider  cephALEXin (KEFLEX) 500 MG capsule Take 1 capsule (500 mg total) by mouth 4 (four) times daily. 03/02/13   Carvel Getting, NP  cyclobenzaprine (FLEXERIL) 10 MG tablet Take 1 tablet (10 mg total) by mouth 2 (two) times daily as needed for muscle spasms. 09/26/19   Sharion Balloon, NP  erythromycin ophthalmic ointment Place a 1/2 inch ribbon of ointment into the lower eyelid q 4-6 hrs 05/16/15   Gregor Hams, MD  ibuprofen (ADVIL) 800 MG tablet Take 1 tablet (800 mg total) by mouth every 8 (eight) hours as needed. 09/26/19   Sharion Balloon, NP    Family History Family History  Family history unknown: Yes    Social History Social History   Tobacco Use  . Smoking status: Current Every Day Smoker    Packs/day: 0.50    Types: Cigarettes  Substance Use Topics  . Alcohol use: Yes  . Drug use: No     Allergies   Patient has no known allergies.   Review of Systems Review of Systems  Constitutional: Negative for chills and fever.  HENT: Negative for ear pain and sore throat.   Eyes: Negative  for pain and visual disturbance.  Respiratory: Negative for cough and shortness of breath.   Cardiovascular: Negative for chest pain and palpitations.  Gastrointestinal: Negative for abdominal pain and vomiting.  Genitourinary: Negative for dysuria and hematuria.  Musculoskeletal: Positive for arthralgias. Negative for back pain.  Skin: Negative for color change and rash.  Neurological: Negative for seizures and syncope.  All other systems reviewed and are negative.    Physical Exam Triage Vital Signs ED Triage Vitals  Enc Vitals Group     BP 09/26/19 1044 (!) 157/103     Pulse Rate 09/26/19 1044 79     Resp 09/26/19 1044 18     Temp 09/26/19 1044 99 F (37.2 C)     Temp Source 09/26/19 1044 Oral     SpO2 09/26/19 1044 95 %     Weight --      Height --      Head Circumference --      Peak Flow --      Pain Score 09/26/19 1046 9     Pain Loc --      Pain Edu? --      Excl. in [place]? --    No data found.  Updated Vital Signs BP (!) 157/103 (BP Location: Left Arm)   Pulse 79  Temp 99 F (37.2 C) (Oral)   Resp 18   SpO2 95%   Visual Acuity Right Eye Distance:   Left Eye Distance:   Bilateral Distance:    Right Eye Near:   Left Eye Near:    Bilateral Near:     Physical Exam Vitals signs and nursing note reviewed.  Constitutional:      Appearance: He is well-developed.  HENT:     Head: Normocephalic and atraumatic.  Eyes:     Conjunctiva/sclera: Conjunctivae normal.  Neck:     Musculoskeletal: Neck supple.  Cardiovascular:     Rate and Rhythm: Normal rate and regular rhythm.     Heart sounds: No murmur.  Pulmonary:     Effort: Pulmonary effort is normal. No respiratory distress.     Breath sounds: Normal breath sounds.  Abdominal:     Palpations: Abdomen is soft.     Tenderness: There is no abdominal tenderness.  Musculoskeletal: Normal range of motion.        General: Tenderness present. No swelling, deformity or signs of injury.     Right lower leg:  No edema.     Left lower leg: No edema.  Skin:    General: Skin is warm and dry.     Capillary Refill: Capillary refill takes less than 2 seconds.     Findings: No bruising, erythema, lesion or rash.  Neurological:     General: No focal deficit present.     Mental Status: He is alert and oriented to person, place, and time.     Sensory: No sensory deficit.     Motor: No weakness.      UC Treatments / Results  Labs (all labs ordered are listed, but only abnormal results are displayed) Labs Reviewed - No data to display  EKG   Radiology No results found.  Procedures Procedures (including critical care time)  Medications Ordered in UC Medications - No data to display  Initial Impression / Assessment and Plan / UC Course  I have reviewed the triage vital signs and the nursing notes.  Pertinent labs & imaging results that were available during my care of the patient were reviewed by me and considered in my medical decision making (see chart for details).    Right hip pain.  Elevated blood pressure without known hypertension.  Treating with ibuprofen and Flexeril.  Precautions for drowsiness with Flexeril discussed with patient.  Instructed him to return here, follow-up with his PCP, or see an orthopedist if his pain is not improving or he develops new symptoms.  Discussed that his blood pressure is elevated today and that he needs to have this rechecked by his PCP in the next couple of weeks.  Patient agrees to plan of care.     Final Clinical Impressions(s) / UC Diagnoses   Final diagnoses:  Right hip pain  Elevated blood-pressure reading without diagnosis of hypertension     Discharge Instructions     Take the prescribed ibuprofen as needed for your pain.  Take the muscle relaxer Flexeril as needed for muscle spasm; do not drive, operate machinery, or drink alcohol with this medication as it may make you drowsy.    Return here, follow up with your primary care  provider, or see an orthopedist if your pain is not improving or gets worse; or if you develop new symptoms such as weakness or numbness.    Your blood pressure is elevated today at 157/103.  Please have this rechecked  by your primary care provider in 2 weeks.          ED Prescriptions    Medication Sig Dispense Auth. Provider   ibuprofen (ADVIL) 800 MG tablet Take 1 tablet (800 mg total) by mouth every 8 (eight) hours as needed. 21 tablet Sharion Balloon, NP   cyclobenzaprine (FLEXERIL) 10 MG tablet Take 1 tablet (10 mg total) by mouth 2 (two) times daily as needed for muscle spasms. 20 tablet Sharion Balloon, NP     I have reviewed the PDMP during this encounter.   Sharion Balloon, NP 09/26/19 [phone]

## 2019-09-26 NOTE — ED Triage Notes (Signed)
Pt presents with right side hip pain from unknown source.

## 2019-09-26 NOTE — Discharge Instructions (Addendum)
Take the prescribed ibuprofen as needed for your pain.  Take the muscle relaxer Flexeril as needed for muscle spasm; do not drive, operate machinery, or drink alcohol with this medication as it may make you drowsy.    Return here, follow up with your primary care provider, or see an orthopedist if your pain is not improving or gets worse; or if you develop new symptoms such as weakness or numbness.    Your blood pressure is elevated today at 157/103.  Please have this rechecked by your primary care provider in 2 weeks.

## 2019-09-27 ENCOUNTER — Other Ambulatory Visit (HOSPITAL_COMMUNITY): Payer: Self-pay | Admitting: Family Medicine

## 2019-09-27 ENCOUNTER — Ambulatory Visit (HOSPITAL_COMMUNITY)
Admission: RE | Admit: 2019-09-27 | Discharge: 2019-09-27 | Disposition: A | Discharge status: Home / Self Care | Payer: 59 | Source: Ambulatory Visit | Attending: Family Medicine | Admitting: Family Medicine

## 2019-09-27 ENCOUNTER — Encounter (HOSPITAL_COMMUNITY): Payer: Self-pay

## 2019-09-27 DIAGNOSIS — M25551 Pain in right hip: Secondary | ICD-10-CM | POA: Diagnosis not present

## 2020-01-19 ENCOUNTER — Other Ambulatory Visit: Payer: Self-pay

## 2020-01-19 ENCOUNTER — Ambulatory Visit (HOSPITAL_COMMUNITY)
Admission: EM | Admit: 2020-01-19 | Discharge: 2020-01-19 | Disposition: A | Discharge status: Home / Self Care | Payer: 59 | Attending: Internal Medicine | Admitting: Internal Medicine

## 2020-01-19 ENCOUNTER — Encounter (HOSPITAL_COMMUNITY): Payer: Self-pay

## 2020-01-19 DIAGNOSIS — K047 Periapical abscess without sinus: Secondary | ICD-10-CM | POA: Diagnosis not present

## 2020-01-19 MED ORDER — IBUPROFEN 800 MG PO TABS: 800.0000 mg | ORAL_TABLET | Freq: Three times a day (TID) | ORAL | 0 refills | Status: DC

## 2020-01-19 MED ORDER — AMOXICILLIN-POT CLAVULANATE 875-125 MG PO TABS: 1.0000 | ORAL_TABLET | Freq: Two times a day (BID) | ORAL | 0 refills | Status: AC

## 2020-01-19 MED ORDER — HYDROCODONE-ACETAMINOPHEN 5-325 MG PO TABS: 1.0000 | ORAL_TABLET | Freq: Four times a day (QID) | ORAL | 0 refills | Status: DC | PRN

## 2020-01-19 NOTE — ED Provider Notes (Signed)
[place]    CSN: [id number] Arrival date & time: 01/19/20  1200      History   Chief Complaint Chief Complaint  Patient presents with  . Dental Pain    HPI Louis Griffin is a 59 y.o. male presenting today for evaluation of dental pain and swelling.  Patient states that over the past 3 to 4 days he has developed increased pain and swelling to his right lower jaw.  Notes that he broke his posterior molar in this area approximately 6 to 8 months ago.  He has been taking Tylenol and ibuprofen without relief.  Reports difficulty sleeping due to pain.  Does report some swelling.  Denies ear pain.  Denies fevers.  Denies neck stiffness.  Denies difficulty swallowing.  HPI  History reviewed. No pertinent past medical history.  There are no problems to display for this patient.   Past Surgical History:  Procedure Laterality Date  . CERVICAL DISC SURGERY         Home Medications    Prior to Admission medications   Medication Sig Start Date End Date Taking? Authorizing Provider  aspirin 81 MG chewable tablet Chew by mouth daily.   Yes [provider]  cholecalciferol (VITAMIN D3) 25 MCG (1000 UNIT) tablet Take 1,000 Units by mouth daily.   Yes [provider]  amoxicillin-clavulanate (AUGMENTIN) 875-125 MG tablet Take 1 tablet by mouth every 12 (twelve) hours for 7 days. 01/19/20 01/26/20  Mischell Branford C, PA-C  HYDROcodone-acetaminophen (NORCO/VICODIN) 5-325 MG tablet Take 1 tablet by mouth every 6 (six) hours as needed for severe pain. 01/19/20   Anet Log[place]on C, PA-C  ibuprofen (ADVIL) 800 MG tablet Take 1 tablet (800 mg total) by mouth 3 (three) times daily. 01/19/20   Demetris Capell, Elesa Hacker, PA-C    Family History Family History  Problem Relation Age of Onset  . Healthy Mother   . Healthy Father     Social History Social History   Tobacco Use  . Smoking status: Current Every Day Smoker    Packs/day: 0.50    Types: Cigarettes  Substance  Use Topics  . Alcohol use: Yes  . Drug use: No     Allergies   Patient has no known allergies.   Review of Systems Review of Systems  Constitutional: Negative for activity change, appetite change, chills, fatigue and fever.  HENT: Positive for dental problem and facial swelling. Negative for congestion, ear pain, rhinorrhea, sinus pressure, sore throat and trouble swallowing.   Eyes: Negative for discharge and redness.  Respiratory: Negative for cough, chest tightness and shortness of breath.   Cardiovascular: Negative for chest pain.  Gastrointestinal: Negative for abdominal pain, diarrhea, nausea and vomiting.  Musculoskeletal: Negative for myalgias, neck pain and neck stiffness.  Skin: Negative for rash.  Neurological: Negative for dizziness, light-headedness and headaches.     Physical Exam Triage Vital Signs ED Triage Vitals  Enc Vitals Group     BP 01/19/20 1240 (!) 156/93     Pulse Rate 01/19/20 1240 97     Resp 01/19/20 1240 16     Temp 01/19/20 1240 98.1 F (36.7 C)     Temp Source 01/19/20 1240 Oral     SpO2 01/19/20 1240 97 %     Weight --      Height --      Head Circumference --      Peak Flow --      Pain Score 01/19/20 1249 10  Pain Loc --      Pain Edu? --      Excl. in [place]? --    No data found.  Updated Vital Signs BP (!) 156/93 (BP Location: Right Arm)   Pulse 97   Temp 98.1 F (36.7 C) (Oral)   Resp 16   SpO2 97%   Visual Acuity Right Eye Distance:   Left Eye Distance:   Bilateral Distance:    Right Eye Near:   Left Eye Near:    Bilateral Near:     Physical Exam Vitals and nursing note reviewed.  Constitutional:      Appearance: He is well-developed.  HENT:     Head: Normocephalic and atraumatic.     Comments: Mild swelling noted to right lower jaw, tender to palpation over this area, does not extend below mandibular line, no tenderness to submental area    Ears:     Comments: Bilateral ears without tenderness to palpation of  external auricle, tragus and mastoid, EAC's without erythema or swelling, TM's with good bony landmarks and cone of light. Non erythematous.     Mouth/Throat:     Comments: Oral mucosa pink and moist, no tonsillar enlargement or exudate. Posterior pharynx patent and nonerythematous, no uvula deviation or swelling. Normal phonation.  Broken decaying appearing right posterior molar on the lower jaw, surrounding gingival tenderness and swelling, no soft palate swelling noted, nontender to palpation to soft palate below tongue Eyes:     Conjunctiva/sclera: Conjunctivae normal.  Neck:     Comments: No neck swelling or erythema, full active range of motion of neck, no lymphadenopathy palpated Cardiovascular:     Rate and Rhythm: Normal rate and regular rhythm.     Heart sounds: No murmur.  Pulmonary:     Effort: Pulmonary effort is normal. No respiratory distress.     Breath sounds: Normal breath sounds.  Abdominal:     Palpations: Abdomen is soft.     Tenderness: There is no abdominal tenderness.  Musculoskeletal:     Cervical back: Neck supple.  Skin:    General: Skin is warm and dry.  Neurological:     Mental Status: He is alert.      UC Treatments / Results  Labs (all labs ordered are listed, but only abnormal results are displayed) Labs Reviewed - No data to display  EKG   Radiology No results found.  Procedures Procedures (including critical care time)  Medications Ordered in UC Medications - No data to display  Initial Impression / Assessment and Plan / UC Course  I have reviewed the triage vital signs and the nursing notes.  Pertinent labs & imaging results that were available during my care of the patient were reviewed by me and considered in my medical decision making (see chart for details).     Dental abscess-begin Augmentin twice daily x1 week, provided ibuprofen 2.  Tylenol for mild to moderate pain.  Provided 2 days worth of hydrocodone to use for at  home/bedtime pain as antibiotic begins to work.  Follow-up with dentistry.  Registry checked, benefit greater than risk.  Discussed drowsiness associated with these medicines.  Exam not suggestive of deep space infection or Ludwick's angina at this time.  Discussed strict return precautions. Patient verbalized understanding and is agreeable with plan.  Final Clinical Impressions(s) / UC Diagnoses   Final diagnoses:  Dental abscess     Discharge Instructions     Please use dental resource to contact offices to seek  permenant treatment/relief.   Today we have given you an antibiotic-begin Augmentin twice daily for the next week, take with food. This should help with pain as any infection is cleared.   For pain please take 600mg -800mg  of Ibuprofen every 8 hours, take with 1000 mg of Tylenol Extra strength every 8 hours. These are safe to take together. Please take with food.   I have also provided 2 days worth of stronger pain medication- hydrocodone. This should only be used for severe pain. Do not drive or operate machinery while taking this medication.   Please return if you start to experience significant swelling of your face, experiencing fever.   ED Prescriptions    Medication Sig Dispense Auth. Provider   amoxicillin-clavulanate (AUGMENTIN) 875-125 MG tablet Take 1 tablet by mouth every 12 (twelve) hours for 7 days. 14 tablet Jalah Warmuth C, PA-C   ibuprofen (ADVIL) 800 MG tablet Take 1 tablet (800 mg total) by mouth 3 (three) times daily. 21 tablet Tanita Palinkas C, PA-C   HYDROcodone-acetaminophen (NORCO/VICODIN) 5-325 MG tablet Take 1 tablet by mouth every 6 (six) hours as needed for severe pain. 8 tablet Joevon Holliman, [place] C, PA-C     I have reviewed the PDMP during this encounter.   Janith Lima, PA-C 01/19/20 1316

## 2020-01-19 NOTE — Discharge Instructions (Signed)
Please use dental resource to contact offices to seek permenant treatment/relief.   Today we have given you an antibiotic-begin Augmentin twice daily for the next week, take with food. This should help with pain as any infection is cleared.   For pain please take 600mg -800mg  of Ibuprofen every 8 hours, take with 1000 mg of Tylenol Extra strength every 8 hours. These are safe to take together. Please take with food.   I have also provided 2 days worth of stronger pain medication- hydrocodone. This should only be used for severe pain. Do not drive or operate machinery while taking this medication.   Please return if you start to experience significant swelling of your face, experiencing fever.

## 2020-01-19 NOTE — ED Triage Notes (Signed)
Pt c/o tooth pain lower right molar area since Thursday. States left jaw area feels swollen. Denies fever, chills. Unable to get appt with dentist for same. Tooth broke off approx 6 months ago. Took ibuprofen at approx 1100 this a.m.

## 2020-03-05 IMAGING — DX DG HIP (WITH OR WITHOUT PELVIS) 2-3V*R*
3 series · 3 of 3 positions shown · non-contrast
Comparison: None.

CLINICAL DATA: 58-year-old male with right hip pain. No known
injury.

EXAM:
DG HIP (WITH OR WITHOUT PELVIS) 2-3V RIGHT

[pelvis ap]
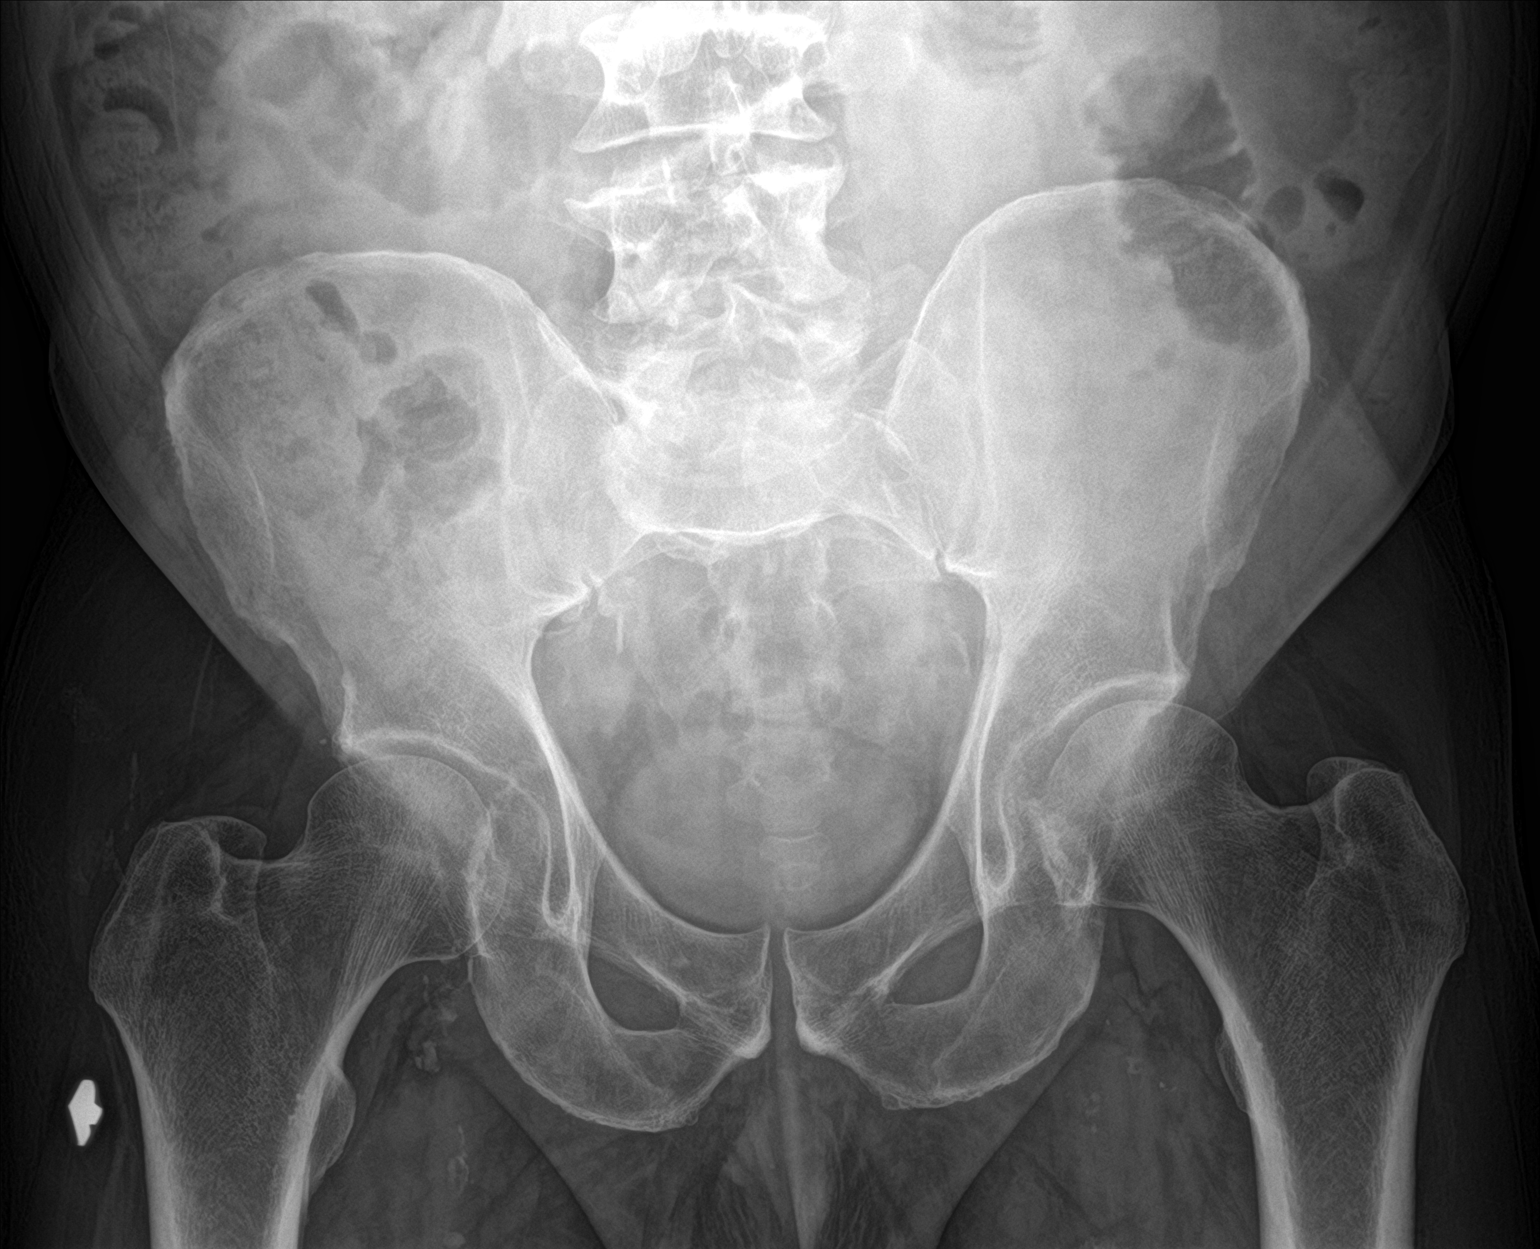

[hip lat]
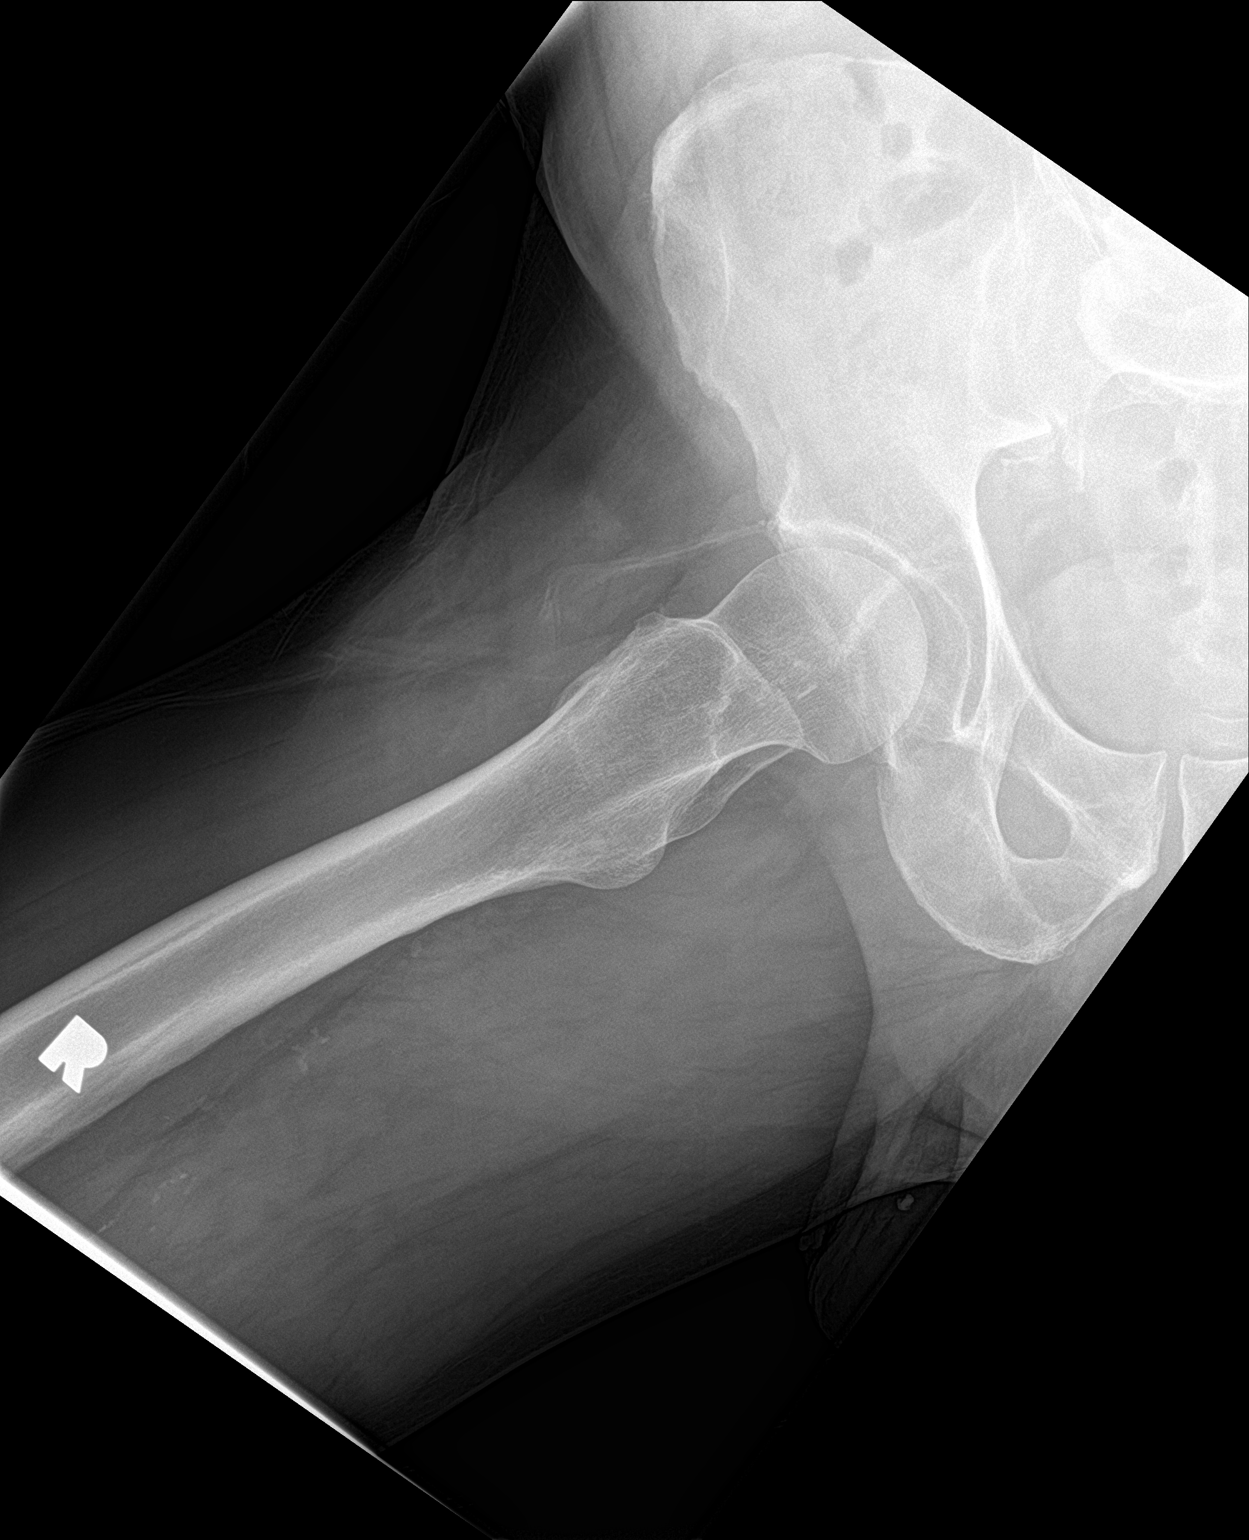

[hip ap]
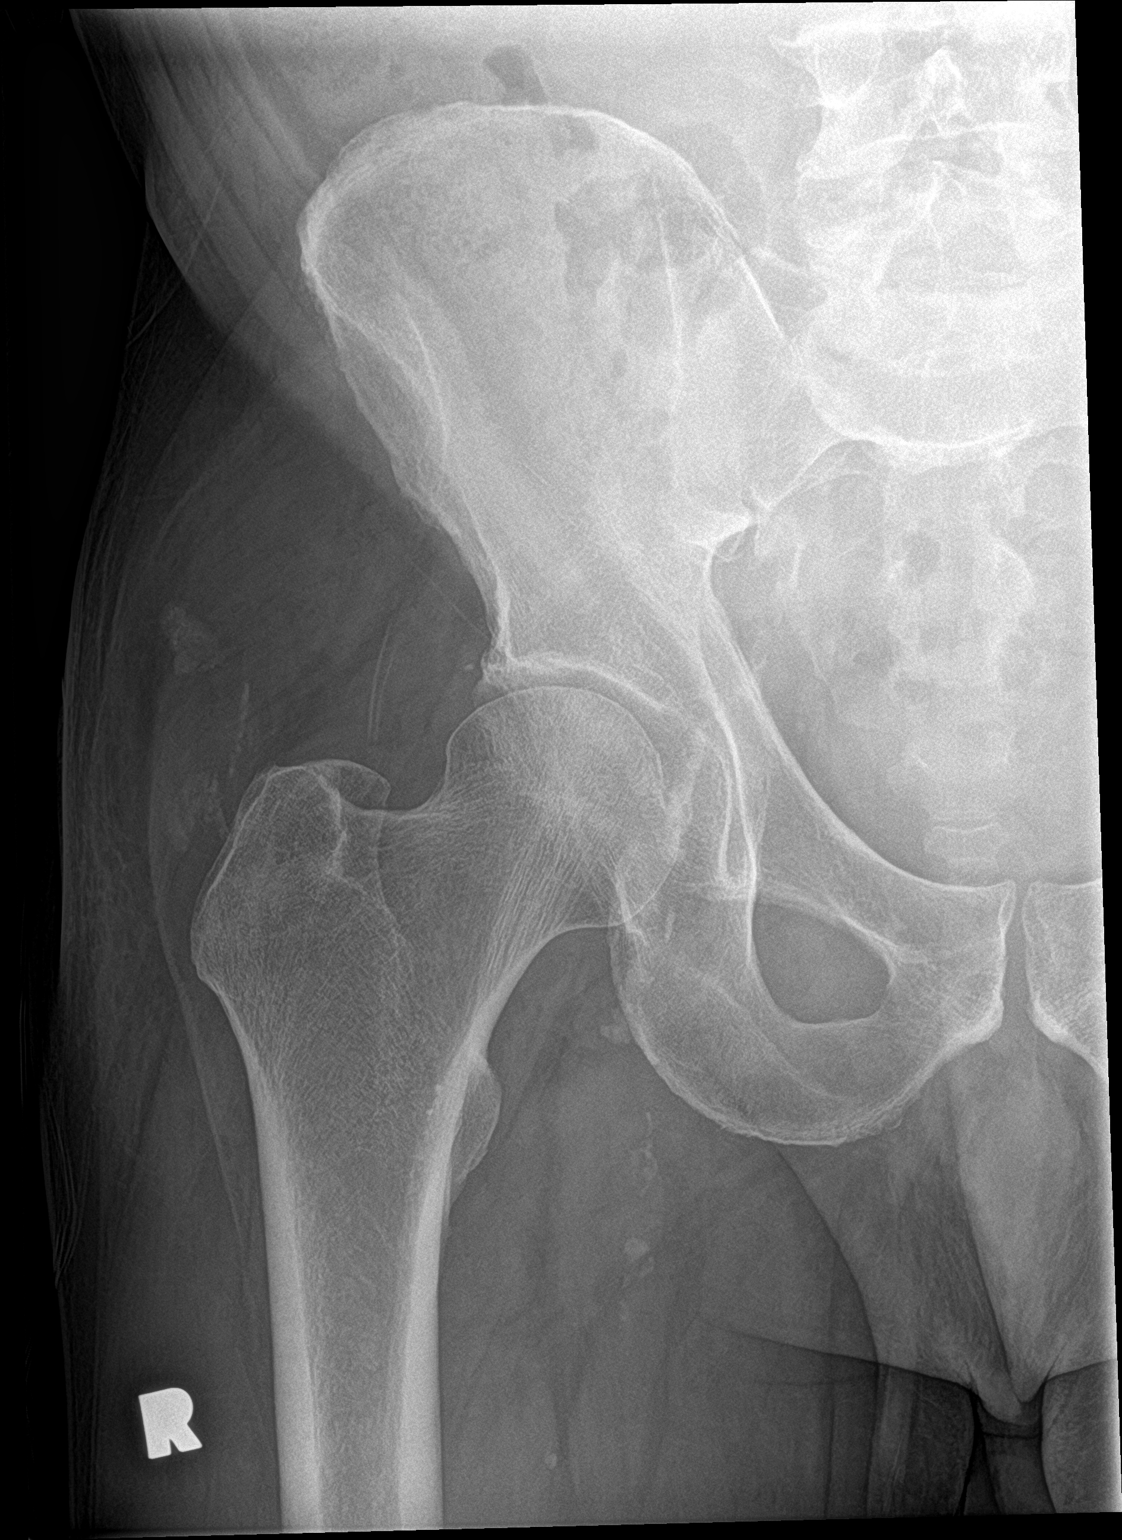

[3 of 3 positions shown; findings below may reference images not displayed]

FINDINGS: There is no acute fracture or dislocation. The bones are well
mineralized. Mild arthritic changes. The soft tissues are
unremarkable. Vascular calcifications noted.
IMPRESSION: No acute fracture or dislocation.

## 2022-01-31 ENCOUNTER — Encounter (HOSPITAL_COMMUNITY): Payer: Self-pay | Admitting: *Deleted

## 2022-01-31 ENCOUNTER — Ambulatory Visit (HOSPITAL_COMMUNITY)
Admission: EM | Admit: 2022-01-31 | Discharge: 2022-01-31 | Disposition: A | Discharge status: Home / Self Care | Payer: 59 | Attending: Emergency Medicine | Admitting: Emergency Medicine

## 2022-01-31 ENCOUNTER — Other Ambulatory Visit: Payer: Self-pay

## 2022-01-31 DIAGNOSIS — R42 Dizziness and giddiness: Secondary | ICD-10-CM | POA: Diagnosis not present

## 2022-01-31 DIAGNOSIS — J329 Chronic sinusitis, unspecified: Secondary | ICD-10-CM | POA: Diagnosis not present

## 2022-01-31 DIAGNOSIS — J310 Chronic rhinitis: Secondary | ICD-10-CM | POA: Diagnosis not present

## 2022-01-31 HISTORY — DX: Vitamin D deficiency, unspecified: E55.9

## 2022-01-31 HISTORY — DX: Dizziness and giddiness: R42

## 2022-01-31 MED ORDER — METHYLPREDNISOLONE SODIUM SUCC 125 MG IJ SOLR
125.0000 mg | Freq: Once | INTRAMUSCULAR | Status: AC
  Administered 2022-01-31: 125 mg via INTRAMUSCULAR

## 2022-01-31 MED ORDER — PSEUDOEPHEDRINE HCL 30 MG PO TABS: 60.0000 mg | ORAL_TABLET | Freq: Three times a day (TID) | ORAL | 0 refills | Status: AC | PRN

## 2022-01-31 MED ORDER — IPRATROPIUM BROMIDE 0.06 % NA SOLN: 2.0000 | Freq: Four times a day (QID) | NASAL | 0 refills | Status: AC

## 2022-01-31 MED ORDER — MECLIZINE HCL 25 MG PO TABS: 25.0000 mg | ORAL_TABLET | Freq: Three times a day (TID) | ORAL | 0 refills | Status: DC | PRN

## 2022-01-31 MED ORDER — METHYLPREDNISOLONE SODIUM SUCC 125 MG IJ SOLR
INTRAMUSCULAR | Status: AC
  Filled 2022-01-31: qty 2

## 2022-01-31 NOTE — Discharge Instructions (Addendum)
I have enclosed instructions for 2 different maneuvers that you can perform at home to help alleviate your symptoms.  Please find the one that works the best for you and try to perform this 3 times daily.  You were provided with an injection of methylprednisolone to settle down any inflammation that is in your ears, eustachian tube and sinuses that may be contributing to your dizziness.  I recommend that you begin 3 medications to help keep upper airway inflammation at bay, meclizine which is commonly prescribed for vertigo because it is a related antihistamine.  Atrovent nasal spray which can be sprayed in the nostrils 4 times daily to dry up mucous membranes.  Sudafed which is a decongestant that will stop mucus production and help dry up the excess fluid behind eardrums.  If you have not had meaningful relief of your symptoms in the next 3 to 5 days, please follow-up with your primary care provider.  Thank you for visiting urgent care today.

## 2022-01-31 NOTE — ED Provider Notes (Signed)
[facility]    CSN: [id number] Arrival date & time: 01/31/22  1508    HISTORY   Chief Complaint  Patient presents with   Dizziness   HPI Louis Griffin is a 61 y.o. male. Pt had onset of vertigo yesterday.  States had vertigo several yrs ago, does not recall how it was treated.  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  Patient states he is also had some nausea, but no vomiting. Dizziness slightly improved today but is definitely still present and bothersome.  Patient is here with his wife today, she states he is mentating well is at baseline.  Patient states has not had a full physical exam in the past 3 years due to COVID-19.  Patient states he knows he is vitamin D deficient and has not been taking vitamin D replacement lately.  The history is provided by the patient.  Past Medical History:  Diagnosis Date   Vertigo    Vitamin D deficiency    There are no problems to display for this patient.  Past Surgical History:  Procedure Laterality Date   CERVICAL DISC SURGERY      Home Medications    Prior to Admission medications   Medication Sig Start Date End Date Taking? Authorizing Provider  aspirin 81 MG chewable tablet Chew by mouth daily.    [provider]  cholecalciferol (VITAMIN D3) 25 MCG (1000 UNIT) tablet Take 1,000 Units by mouth daily.    [provider]    Family History Family History  Problem Relation Age of Onset   Healthy Mother    Healthy Father    Social History Social History   Tobacco Use   Smoking status: Every Day    Packs/day: 0.50    Types: Cigarettes  Vaping Use   Vaping Use: Never used  Substance Use Topics   Alcohol use: Yes    Comment: 5-6 cans beer approx 5-6 days per week   Drug use: Never   Allergies   Patient has no known allergies.  Review of Systems Review of  Systems Pertinent findings noted in history of present illness.   Physical Exam Triage Vital Signs ED Triage Vitals  Enc Vitals Group     BP 10/23/21 0827 (!) 147/82     Pulse Rate 10/23/21 0827 72     Resp 10/23/21 0827 18     Temp 10/23/21 0827 98.3 F (36.8 C)     Temp Source 10/23/21 0827 Oral     SpO2 10/23/21 0827 98 %     Weight --      Height --      Head Circumference --      Peak Flow --      Pain Score 10/23/21 0826 5     Pain Loc --      Pain Edu? --      Excl. in [place]? --   No data found.  Updated Vital Signs BP 131/90    Pulse 74    Temp 98 F (36.7 C) (Oral)    Resp 14    SpO2 94%   Physical Exam Vitals and nursing note reviewed.  Constitutional:      General: He is not in acute distress.    Appearance: Normal appearance. He is not ill-appearing.  HENT:     Head: Normocephalic and atraumatic.  Salivary Glands: Right salivary gland is not diffusely enlarged or tender. Left salivary gland is not diffusely enlarged or tender.     Right Ear: Ear canal and external ear normal. No drainage. A middle ear effusion is present. There is no impacted cerumen. Tympanic membrane is bulging. Tympanic membrane is not injected or erythematous.     Left Ear: Ear canal and external ear normal. No drainage. A middle ear effusion is present. There is no impacted cerumen. Tympanic membrane is bulging. Tympanic membrane is not injected or erythematous.     Ears:     Comments: Bilateral EACs normal, both TMs bulging with clear fluid    Nose: Rhinorrhea present. No nasal deformity, septal deviation, signs of injury, nasal tenderness, mucosal edema or congestion. Rhinorrhea is clear.     Right Nostril: Occlusion present. No foreign body, epistaxis or septal hematoma.     Left Nostril: Occlusion present. No foreign body, epistaxis or septal hematoma.     Right Turbinates: Enlarged, swollen and pale.     Left Turbinates: Enlarged, swollen and pale.     Right Sinus: No maxillary sinus  tenderness or frontal sinus tenderness.     Left Sinus: No maxillary sinus tenderness or frontal sinus tenderness.     Mouth/Throat:     Lips: Pink. No lesions.     Mouth: Mucous membranes are moist. No oral lesions.     Pharynx: Oropharynx is clear. Uvula midline. No posterior oropharyngeal erythema or uvula swelling.     Tonsils: No tonsillar exudate. 0 on the right. 0 on the left.     Comments: Postnasal drip Eyes:     General: Lids are normal.        Right eye: No discharge.        Left eye: No discharge.     Extraocular Movements: Extraocular movements intact.     Conjunctiva/sclera: Conjunctivae normal.     Right eye: Right conjunctiva is not injected.     Left eye: Left conjunctiva is not injected.  Neck:     Trachea: Trachea and phonation normal.  Cardiovascular:     Rate and Rhythm: Normal rate and regular rhythm.     Pulses: Normal pulses.     Heart sounds: Normal heart sounds. No murmur heard.   No friction rub. No gallop.  Pulmonary:     Effort: Pulmonary effort is normal. No accessory muscle usage, prolonged expiration or respiratory distress.     Breath sounds: Normal breath sounds. No stridor, decreased air movement or transmitted upper airway sounds. No decreased breath sounds, wheezing, rhonchi or rales.  Chest:     Chest wall: No tenderness.  Musculoskeletal:        General: Normal range of motion.     Cervical back: Normal range of motion and neck supple. Normal range of motion.  Lymphadenopathy:     Cervical: No cervical adenopathy.  Skin:    General: Skin is warm and dry.     Findings: No erythema or rash.  Neurological:     General: No focal deficit present.     Mental Status: He is alert and oriented to person, place, and time.  Psychiatric:        Mood and Affect: Mood normal.        Behavior: Behavior normal.    Visual Acuity Right Eye Distance:   Left Eye Distance:   Bilateral Distance:    Right Eye Near:   Left Eye Near:    Bilateral  Near:     UC Couse / Diagnostics / Procedures:    EKG  Radiology No results found.  Procedures Procedures (including critical care time)  UC Diagnoses / Final Clinical Impressions(s)   I have reviewed the triage vital signs and the nursing notes.  Pertinent labs & imaging results that were available during my care of the patient were reviewed by me and considered in my medical decision making (see chart for details).    Final diagnoses:  Vertigo  Rhinosinusitis   Patient educated regarding Epley maneuver and the modified Semont maneuver to perform at home.  Recommend decongestants, meclizine.  Follow-up with primary care ASAP for full physical exam.  Report to ED for worsening symptoms.  ED Prescriptions     Medication Sig Dispense Auth. Provider   ipratropium (ATROVENT) 0.06 % nasal spray Place 2 sprays into both nostrils 4 (four) times daily. As needed for nasal congestion, runny nose 15 mL Lynden Oxford Scales, PA-C   pseudoephedrine (SUDAFED) 30 MG tablet Take 2 tablets (60 mg total) by mouth every 8 (eight) hours as needed for up to 3 days for congestion. 18 tablet Lynden Oxford Scales, PA-C   meclizine (ANTIVERT) 25 MG tablet Take 1 tablet (25 mg total) by mouth 3 (three) times daily as needed for dizziness or nausea. 30 tablet Lynden Oxford Scales, PA-C      PDMP not reviewed this encounter.  Pending results:  Labs Reviewed - No data to display  Medications Ordered in UC: Medications  methylPREDNISolone sodium succinate (SOLU-MEDROL) 125 mg/2 mL injection 125 mg (125 mg Intramuscular Given 01/31/22 1703)    Disposition Upon Discharge:  Condition: stable for discharge home Home: take medications as prescribed; routine discharge instructions as discussed; follow up as advised.  Patient presented with an acute illness with associated systemic symptoms and significant discomfort requiring urgent management. In my opinion, this is a condition that a prudent lay  person (someone who possesses an average knowledge of health and medicine) may potentially expect to result in complications if not addressed urgently such as respiratory distress, impairment of bodily function or dysfunction of bodily organs.   Routine symptom specific, illness specific and/or disease specific instructions were discussed with the patient and/or caregiver at length.   As such, the patient has been evaluated and assessed, work-up was performed and treatment was provided in alignment with urgent care protocols and evidence based medicine.  Patient/parent/caregiver has been advised that the patient may require follow up for further testing and treatment if the symptoms continue in spite of treatment, as clinically indicated and appropriate.  If the patient was tested for COVID-19, Influenza and/or RSV, then the patient/parent/guardian was advised to isolate at home pending the results of his/her diagnostic coronavirus test and potentially longer if theyre positive. I have also advised pt that if his/her COVID-19 test returns positive, it's recommended to self-isolate for at least 10 days after symptoms first appeared AND until fever-free for 24 hours without fever reducer AND other symptoms have improved or resolved. Discussed self-isolation recommendations as well as instructions for household member/close contacts as per the [facility] and [place] DHHS, and also gave patient the [place] packet with this information.  Patient/parent/caregiver has been advised to return to the [facility] or PCP in 3-5 days if no better; to PCP or the Emergency Department if new signs and symptoms develop, or if the current signs or symptoms continue to change or worsen for further workup, evaluation and treatment as clinically indicated and appropriate  The patient will follow up with their current PCP if and as advised. If the patient does not currently have a PCP we will assist them in obtaining one.   The patient may need  specialty follow up if the symptoms continue, in spite of conservative treatment and management, for further workup, evaluation, consultation and treatment as clinically indicated and appropriate.   Patient/parent/caregiver verbalized understanding and agreement of plan as discussed.  All questions were addressed during visit.  Please see discharge instructions below for further details of plan.  Discharge Instructions:   Discharge Instructions      I have enclosed instructions for 2 different maneuvers that you can perform at home to help alleviate your symptoms.  Please find the one that works the best for you and try to perform this 3 times daily.  You were provided with an injection of methylprednisolone to settle down any inflammation that is in your ears, eustachian tube and sinuses that may be contributing to your dizziness.  I recommend that you begin 3 medications to help keep upper airway inflammation at bay, meclizine which is commonly prescribed for vertigo because it is a related antihistamine.  Atrovent nasal spray which can be sprayed in the nostrils 4 times daily to dry up mucous membranes.  Sudafed which is a decongestant that will stop mucus production and help dry up the excess fluid behind eardrums.  If you have not had meaningful relief of your symptoms in the next 3 to 5 days, please follow-up with your primary care provider.  Thank you for visiting urgent care today.    This office note has been dictated using Museum/gallery curator.  Unfortunately, and despite my best efforts, this method of dictation can sometimes lead to occasional typographical or grammatical errors.  I apologize in advance if this occurs.     Lynden Oxford Scales, [place] 02/01/22 [phone]

## 2022-01-31 NOTE — ED Triage Notes (Signed)
Pt reports starting with vertigo yesterday.  States had vertigo several yrs ago.  C/O "room spinning" with position changes. Had some nausea, but no vomiting. Dizziness slightly improved today.

## 2023-02-08 ENCOUNTER — Ambulatory Visit
Admission: EM | Admit: 2023-02-08 | Discharge: 2023-02-08 | Disposition: A | Discharge status: Home / Self Care | Payer: 59 | Attending: Nurse Practitioner | Admitting: Nurse Practitioner

## 2023-02-08 ENCOUNTER — Telehealth: Payer: Self-pay

## 2023-02-08 DIAGNOSIS — Z87898 Personal history of other specified conditions: Secondary | ICD-10-CM | POA: Diagnosis not present

## 2023-02-08 DIAGNOSIS — J019 Acute sinusitis, unspecified: Secondary | ICD-10-CM | POA: Diagnosis not present

## 2023-02-08 DIAGNOSIS — B9689 Other specified bacterial agents as the cause of diseases classified elsewhere: Secondary | ICD-10-CM | POA: Diagnosis not present

## 2023-02-08 MED ORDER — AMOXICILLIN-POT CLAVULANATE 875-125 MG PO TABS: 1.0000 | ORAL_TABLET | Freq: Two times a day (BID) | ORAL | 0 refills | Status: DC

## 2023-02-08 MED ORDER — AMOXICILLIN-POT CLAVULANATE 875-125 MG PO TABS: 1.0000 | ORAL_TABLET | Freq: Two times a day (BID) | ORAL | 0 refills | Status: AC

## 2023-02-08 MED ORDER — MECLIZINE HCL 25 MG PO TABS: 25.0000 mg | ORAL_TABLET | Freq: Three times a day (TID) | ORAL | 0 refills | Status: AC | PRN

## 2023-02-08 MED ORDER — MECLIZINE HCL 25 MG PO TABS: 25.0000 mg | ORAL_TABLET | Freq: Three times a day (TID) | ORAL | 0 refills | Status: DC | PRN

## 2023-02-08 NOTE — Discharge Instructions (Signed)
I believe you have a bacterial sinus infection.  Please take the Augmentin as prescribed to treat it.  Please also start taking Mucinex 600 mg twice daily to help break up the congestion.  Also start nasal saline rinses or a nasal lavage with saline to help clear out the sinuses.  Make sure you are drinking plenty of fluids like water or sugar-free electrolytes.  Seek care if symptoms persist or worsen despite treatment.  You can continue to take the Antivert as needed for vertigo.  I have attached the Epley maneuver to the back of this packet if you want to try that at home.  Also recommend following up with a primary care provider-we have added you to primary care assistance and will contact you regarding neck steps on how to get in with a primary care provider.

## 2023-02-08 NOTE — ED Provider Notes (Signed)
[facility]    CSN: [id number] Arrival date & time: 02/08/23  1051      History   Chief Complaint Chief Complaint  Patient presents with   Sore Throat   Cough    HPI Louis Griffin is a 62 y.o. male.   Patient presents today with wife for approximately 12-day history of bodyaches, congested and dry cough, shortness of breath in the morning when he wakes up, wheezing overnight and in the morning, dry throat, congestion on his throat, hoarseness, and fatigue.  Also reports intermittent sharp pain in his upper left back that comes and goes.  No shortness of breath at rest, chest pain, chest tightness, chest or nasal congestion, runny nose, headache, ear pain or pressure, abdominal pain, nausea/vomiting, diarrhea, or decreased appetite.  Reports one of his coworkers tested positive for COVID-19 yesterday.  He took Motrin last week a couple of times to help with his symptoms.  Patient denies history of chronic lung disease.  Wife reports he has needed inhalers in the past to help with illnesses.  Reports smoking history, he quit smoking about 1 year ago.  Reports prior to that, he smoked about 1 pack/day for 40 years.  Patient also reports history of vertigo.  Reports he works at times laying on his back and moving around on his back or bending down and looking under things.  Reports sometimes when he does this, the room starts to spin and he gets very dizzy and nauseous.  Has taken meclizine in the past, however it makes him sleepy.    Past Medical History:  Diagnosis Date   Vertigo    Vitamin D deficiency     There are no problems to display for this patient.   Past Surgical History:  Procedure Laterality Date   CERVICAL DISC SURGERY         Home Medications    Prior to Admission medications   Medication Sig Start Date End Date Taking? Authorizing Provider  amoxicillin-clavulanate (AUGMENTIN) 875-125 MG tablet Take 1 tablet by mouth 2 (two) times daily for 7  days. 02/08/23 02/15/23  Eulogio Bear, NP  aspirin 81 MG chewable tablet Chew by mouth daily.    [provider]  cholecalciferol (VITAMIN D3) 25 MCG (1000 UNIT) tablet Take 1,000 Units by mouth daily.    [provider]  ipratropium (ATROVENT) 0.06 % nasal spray Place 2 sprays into both nostrils 4 (four) times daily. As needed for nasal congestion, runny nose 01/31/22   Lynden Oxford Scales, PA-C  meclizine (ANTIVERT) 25 MG tablet Take 1 tablet (25 mg total) by mouth 3 (three) times daily as needed for dizziness or nausea. Do not take with alcohol or while driving or operating heavy machinery.  May cause drowsiness. 02/08/23   Eulogio Bear, NP    Family History Family History  Problem Relation Age of Onset   Healthy Mother    Healthy Father     Social History Social History   Tobacco Use   Smoking status: Former    Packs/day: 0.50    Types: Cigarettes    Quit date: 12/15/2022    Years since quitting: 0.1  Vaping Use   Vaping Use: Never used  Substance Use Topics   Alcohol use: Yes    Comment: 5-6 cans beer approx 5-6 days per week   Drug use: Never     Allergies   Patient has no known allergies.   Review of Systems Review of Systems  Per HPI  Physical Exam Triage Vital Signs ED Triage Vitals  Enc Vitals Group     BP 02/08/23 1154 (!) 143/97     Pulse Rate 02/08/23 1154 90     Resp 02/08/23 1154 18     Temp 02/08/23 1154 97.9 F (36.6 C)     Temp Source 02/08/23 1154 Oral     SpO2 02/08/23 1154 94 %     Weight --      Height --      Head Circumference --      Peak Flow --      Pain Score 02/08/23 1156 6     Pain Loc --      Pain Edu? --      Excl. in [place]? --    No data found.  Updated Vital Signs BP (!) 143/97 (BP Location: Right Arm)   Pulse 90   Temp 97.9 F (36.6 C) (Oral)   Resp 18   SpO2 94%   Visual Acuity Right Eye Distance:   Left Eye Distance:   Bilateral Distance:    Right Eye Near:   Left Eye Near:     Bilateral Near:     Physical Exam Vitals and nursing note reviewed.  Constitutional:      General: He is not in acute distress.    Appearance: Normal appearance. He is not ill-appearing or toxic-appearing.  HENT:     Head: Normocephalic and atraumatic.     Right Ear: Tympanic membrane, ear canal and external ear normal. No drainage, swelling or tenderness. No middle ear effusion. Tympanic membrane is not erythematous.     Left Ear: Tympanic membrane, ear canal and external ear normal. No drainage, swelling or tenderness.  No middle ear effusion. Tympanic membrane is not erythematous.     Nose: Congestion and rhinorrhea present.     Right Turbinates: Not swollen.     Left Turbinates: Not swollen.     Right Sinus: Maxillary sinus tenderness and frontal sinus tenderness present.     Left Sinus: Maxillary sinus tenderness and frontal sinus tenderness present.     Mouth/Throat:     Mouth: Mucous membranes are moist.     Pharynx: Oropharynx is clear. Posterior oropharyngeal erythema present. No oropharyngeal exudate.     Tonsils: No tonsillar exudate. 0 on the right. 0 on the left.  Eyes:     General: No scleral icterus.    Extraocular Movements: Extraocular movements intact.  Cardiovascular:     Rate and Rhythm: Normal rate and regular rhythm.  Pulmonary:     Effort: Pulmonary effort is normal. No respiratory distress.     Breath sounds: Normal breath sounds. No wheezing, rhonchi or rales.  Abdominal:     General: Abdomen is flat. Bowel sounds are normal. There is no distension.     Palpations: Abdomen is soft.  Musculoskeletal:     Cervical back: Normal range of motion and neck supple.  Lymphadenopathy:     Cervical: Cervical adenopathy present.  Skin:    General: Skin is warm and dry.     Coloration: Skin is not jaundiced or pale.     Findings: No erythema or rash.  Neurological:     Mental Status: He is alert and oriented to person, place, and time.     Motor: No weakness.   Psychiatric:        Behavior: Behavior is cooperative.      UC Treatments / Results  Labs (all labs ordered  are listed, but only abnormal results are displayed) Labs Reviewed - No data to display  EKG   Radiology No results found.  Procedures Procedures (including critical care time)  Medications Ordered in UC Medications - No data to display  Initial Impression / Assessment and Plan / UC Course  I have reviewed the triage vital signs and the nursing notes.  Pertinent labs & imaging results that were available during my care of the patient were reviewed by me and considered in my medical decision making (see chart for details).   Patient is well-appearing, normotensive, afebrile, not tachycardic, not tachypneic, oxygenating well on room air.    1. Acute bacterial sinusitis Treat with Augmentin twice daily for 7 days Also start Mucinex 600 mg twice daily Push fluids Recommended nasal saline rinses/lavages ER and return precautions discussed with patient  2. History of vertigo Resume meclizine Discussed Epley maneuver No red flags in history or on exam today Recommended close follow-up with primary care provider and primary care provider assistance initiated today  The patient was given the opportunity to ask questions.  All questions answered to their satisfaction.  The patient is in agreement to this plan.    Final Clinical Impressions(s) / UC Diagnoses   Final diagnoses:  Acute bacterial sinusitis  History of vertigo     Discharge Instructions      I believe you have a bacterial sinus infection.  Please take the Augmentin as prescribed to treat it.  Please also start taking Mucinex 600 mg twice daily to help break up the congestion.  Also start nasal saline rinses or a nasal lavage with saline to help clear out the sinuses.  Make sure you are drinking plenty of fluids like water or sugar-free electrolytes.  Seek care if symptoms persist or worsen despite  treatment.  You can continue to take the Antivert as needed for vertigo.  I have attached the Epley maneuver to the back of this packet if you want to try that at home.  Also recommend following up with a primary care provider-we have added you to primary care assistance and will contact you regarding neck steps on how to get in with a primary care provider.        ED Prescriptions     Medication Sig Dispense Auth. Provider   meclizine (ANTIVERT) 25 MG tablet Take 1 tablet (25 mg total) by mouth 3 (three) times daily as needed for dizziness or nausea. Do not take with alcohol or while driving or operating heavy machinery.  May cause drowsiness. 30 tablet Noemi Chapel A, NP   amoxicillin-clavulanate (AUGMENTIN) 875-125 MG tablet Take 1 tablet by mouth 2 (two) times daily for 7 days. 14 tablet Eulogio Bear, NP      PDMP not reviewed this encounter.   Eulogio Bear, NP 02/08/23 [phone]

## 2023-02-08 NOTE — ED Triage Notes (Signed)
Pt reports cough, hoarsens, dry throat and sore throat x 1 1/2 week.   Coworker has COVID.

## 2024-06-25 ENCOUNTER — Other Ambulatory Visit: Payer: Self-pay

## 2024-06-25 ENCOUNTER — Emergency Department (HOSPITAL_BASED_OUTPATIENT_CLINIC_OR_DEPARTMENT_OTHER)
Admission: EM | Admit: 2024-06-25 | Discharge: 2024-06-25 | Disposition: A | Discharge status: Home / Self Care | Attending: Emergency Medicine | Admitting: Emergency Medicine

## 2024-06-25 ENCOUNTER — Emergency Department (HOSPITAL_BASED_OUTPATIENT_CLINIC_OR_DEPARTMENT_OTHER): Admitting: Radiology

## 2024-06-25 DIAGNOSIS — M62838 Other muscle spasm: Secondary | ICD-10-CM | POA: Insufficient documentation

## 2024-06-25 DIAGNOSIS — M542 Cervicalgia: Secondary | ICD-10-CM | POA: Diagnosis present

## 2024-06-25 DIAGNOSIS — Z7982 Long term (current) use of aspirin: Secondary | ICD-10-CM | POA: Diagnosis not present

## 2024-06-25 DIAGNOSIS — Z87891 Personal history of nicotine dependence: Secondary | ICD-10-CM | POA: Insufficient documentation

## 2024-06-25 DIAGNOSIS — G2582 Stiff-man syndrome: Secondary | ICD-10-CM

## 2024-06-25 MED ORDER — OXYCODONE-ACETAMINOPHEN 5-325 MG PO TABS
1.0000 | ORAL_TABLET | Freq: Once | ORAL | Status: AC
  Administered 2024-06-25: 1 via ORAL
  Filled 2024-06-25: qty 1

## 2024-06-25 MED ORDER — CYCLOBENZAPRINE HCL 5 MG PO TABS
5.0000 mg | ORAL_TABLET | Freq: Once | ORAL | Status: AC
  Administered 2024-06-25: 5 mg via ORAL
  Filled 2024-06-25: qty 1

## 2024-06-25 MED ORDER — CYCLOBENZAPRINE HCL 10 MG PO TABS: 10.0000 mg | ORAL_TABLET | Freq: Two times a day (BID) | ORAL | 0 refills | Status: AC | PRN

## 2024-06-25 MED ORDER — IBUPROFEN 600 MG PO TABS: 600.0000 mg | ORAL_TABLET | Freq: Four times a day (QID) | ORAL | 0 refills | Status: AC | PRN

## 2024-06-25 MED ORDER — OXYCODONE-ACETAMINOPHEN 5-325 MG PO TABS: 1.0000 | ORAL_TABLET | Freq: Four times a day (QID) | ORAL | 0 refills | Status: AC | PRN

## 2024-06-25 NOTE — ED Provider Notes (Signed)
 [place] EMERGENCY DEPARTMENT AT [facility] Provider Note   CSN: [id number] Arrival date & time: 06/25/24  9273     Patient presents with: Neck Pain   Louis Griffin is a 63 y.o. male.   HPI 63 year old male presents today complaining of neck pain in the left posterior lateral neck.  He states it started sometime during the night last night and feels like a spasm.  He has not had any trauma.  He denies fever, chills, numbness tingling or weakness.  He states he had neck surgery in 1998 and is concerned that one of the screws may have backed out.  He has had some tick bites over the past couple weeks.  Denies any fever or rashes.  He was a smoker until 2 years ago.  He denies any coronary artery disease he denies chest pain, upper back pain, chest pain, or weakness    Prior to Admission medications   Medication Sig Start Date End Date Taking? Authorizing Provider  cyclobenzaprine  (FLEXERIL ) 10 MG tablet Take 1 tablet (10 mg total) by mouth 2 (two) times daily as needed for muscle spasms. 06/25/24  Yes Levander Houston, MD  ibuprofen  (ADVIL ) 600 MG tablet Take 1 tablet (600 mg total) by mouth every 6 (six) hours as needed. 06/25/24  Yes Levander Houston, MD  oxyCODONE-acetaminophen  (PERCOCET/ROXICET) 5-325 MG tablet Take 1 tablet by mouth every 6 (six) hours as needed for severe pain (pain score 7-10). 06/25/24  Yes Levander Houston, MD  aspirin 81 MG chewable tablet Chew by mouth daily.    [provider]  cholecalciferol (VITAMIN D3) 25 MCG (1000 UNIT) tablet Take 1,000 Units by mouth daily.    [provider]  ipratropium (ATROVENT ) 0.06 % nasal spray Place 2 sprays into both nostrils 4 (four) times daily. As needed for nasal congestion, runny nose 01/31/22   Joesph Shaver Scales, PA-C  meclizine  (ANTIVERT ) 25 MG tablet Take 1 tablet (25 mg total) by mouth 3 (three) times daily as needed for dizziness or nausea. Do not take with alcohol or while driving or operating  heavy machinery.  May cause drowsiness. 02/08/23   Chandra Harlene LABOR, NP    Allergies: Patient has no known allergies.    Review of Systems  Updated Vital Signs BP (!) 145/97   Pulse 96   Temp 98.7 F (37.1 C) (Oral)   Resp 18   SpO2 95%   Physical Exam Vitals and nursing note reviewed.  Constitutional:      Appearance: Normal appearance.  HENT:     Head: Normocephalic.     Right Ear: External ear normal.     Left Ear: External ear normal.     Nose: Nose normal.     Mouth/Throat:     Pharynx: Oropharynx is clear.   Eyes:     Pupils: Pupils are equal, round, and reactive to light.   Neck:     Comments: Patient with tenderness to palpation over trapezius muscle.  He has increased pain with attempting to turn his head to the left or to the right. Carotids are intact there is no JVD Bhagat is midline There is no posterior point tenderness Cardiovascular:     Rate and Rhythm: Normal rate.  Pulmonary:     Effort: Pulmonary effort is normal.  Abdominal:     General: Abdomen is flat.     Palpations: Abdomen is soft.   Musculoskeletal:        General: Normal range of motion.  Cervical back: Tenderness present.   Skin:    General: Skin is warm.     Capillary Refill: Capillary refill takes less than 2 seconds.   Neurological:     General: No focal deficit present.     Mental Status: He is alert.     Cranial Nerves: No cranial nerve deficit.     Sensory: No sensory deficit.     Motor: No weakness.   Psychiatric:        Mood and Affect: Mood normal.        Behavior: Behavior normal.     (all labs ordered are listed, but only abnormal results are displayed) Labs Reviewed - No data to display  EKG: None  Radiology: DG Cervical Spine Complete Result Date: 06/25/2024 CLINICAL DATA:  neck pain ho spinal surgery no trauma, but ttp left posterolateral neck EXAM: CERVICAL SPINE - COMPLETE 4+ VIEW COMPARISON:  None Available. FINDINGS: There is loss of cervical  lordosis, which may be on the basis of positioning or due to muscle spasm, There is grade 1 anterolisthesis of C2 over C3, most likely degenerative. Anterior spinal hardware of C6-7 vertebral bodies noted. There is complete solid fusion. Vertebral body heights are maintained. No fracture or destructive lesion. The C1 lateral masses are symmetrically positioned around the odontoid process. Moderate multilevel degenerative changes in the form of reduced intervertebral disc height, endplate sclerosis/irregularity, facet arthropathy and marginal osteophyte formation. Prevertebral soft tissues within normal limits. IMPRESSION: 1. No acute osseous abnormality of the cervical spine. 2. Moderate multilevel degenerative changes. Electronically Signed   By: Ree Molt M.D.   On: 06/25/2024 08:30     Procedures   Medications Ordered in the ED  cyclobenzaprine  (FLEXERIL ) tablet 5 mg (5 mg Oral Given 06/25/24 0759)  oxyCODONE-acetaminophen  (PERCOCET/ROXICET) 5-325 MG per tablet 1 tablet (1 tablet Oral Given 06/25/24 0758)    Clinical Course as of 06/25/24 0855  Mon Jun 25, 2024  0850 Cervical spine x-rays reviewed interpreted no evidence of acute osseous abnormality of the cervical spine with multilevel degenerative changes noted Hardware appears intact on my interpretation [DR]    Clinical Course User Index [DR] Levander Houston, MD                                 Medical Decision Making Amount and/or Complexity of Data Reviewed Radiology: ordered.  Risk Prescription drug management.   Patient with left posterior lateral neck pain. Differential diagnosis includes but is not limited to infection, trauma, muscular spasm, degenerative disease, vascular etiologies Patient with tenderness and spasm over left perispinal musculature.  X-rays obtained and showed no evidence of acute fracture or hardware displacement Patient is afebrile, no history of IV drug use, no history of trauma, based on physical  exam doubt vascular etiology. Pain appears most consistent with muscular spasm.  Patient treated here with Flexeril  and oxycodone. Patient will be given course of Flexeril  and short course of oxycodone.  He is given precautions regarding use of narcotics and Flexeril .  He is given referral to follow-up with neurosurgery.  We have discussed return precautions including any worsening symptoms, weakness, or other changes in his symptoms.     Final diagnoses:  Neck pain  Muscular rigidity and spasm, progressive    ED Discharge Orders          Ordered    oxyCODONE-acetaminophen  (PERCOCET/ROXICET) 5-325 MG tablet  Every 6 hours PRN  06/25/24 0855    cyclobenzaprine  (FLEXERIL ) 10 MG tablet  2 times daily PRN        06/25/24 0855    ibuprofen  (ADVIL ) 600 MG tablet  Every 6 hours PRN        06/25/24 0855               Levander Houston, MD 06/25/24 [phone]

## 2024-06-25 NOTE — ED Notes (Signed)
 Pt given discharge instructions and reviewed prescriptions. Opportunities given for questions. Pt verbalizes understanding. Jillyn Hidden, RN

## 2024-06-25 NOTE — ED Triage Notes (Signed)
 C/o neck pain starting last night. States feels like spasm. Hx of neck surgery in 1998. Also endorses several tick bites over the last couple weeks. Denies fevers.

## 2024-08-13 ENCOUNTER — Other Ambulatory Visit: Payer: Self-pay | Admitting: Neurosurgery

## 2024-08-13 DIAGNOSIS — M5412 Radiculopathy, cervical region: Secondary | ICD-10-CM

## 2024-08-18 ENCOUNTER — Ambulatory Visit
Admission: RE | Admit: 2024-08-18 | Discharge: 2024-08-18 | Disposition: A | Discharge status: Home / Self Care | Source: Ambulatory Visit | Attending: Neurosurgery | Admitting: Neurosurgery

## 2024-08-18 DIAGNOSIS — M5412 Radiculopathy, cervical region: Secondary | ICD-10-CM

## 2024-12-12 ENCOUNTER — Ambulatory Visit (INDEPENDENT_AMBULATORY_CARE_PROVIDER_SITE_OTHER)

## 2024-12-12 ENCOUNTER — Ambulatory Visit
Admission: EM | Admit: 2024-12-12 | Discharge: 2024-12-12 | Disposition: A | Discharge status: Home / Self Care | Attending: Family Medicine | Admitting: Family Medicine

## 2024-12-12 DIAGNOSIS — R0902 Hypoxemia: Secondary | ICD-10-CM

## 2024-12-12 DIAGNOSIS — J101 Influenza due to other identified influenza virus with other respiratory manifestations: Secondary | ICD-10-CM

## 2024-12-12 DIAGNOSIS — R051 Acute cough: Secondary | ICD-10-CM

## 2024-12-12 LAB — POC COVID19/FLU A&B COMBO
Covid Antigen, POC: NEGATIVE
Influenza A Antigen, POC: POSITIVE — AB
Influenza B Antigen, POC: NEGATIVE

## 2024-12-12 MED ORDER — IPRATROPIUM-ALBUTEROL 0.5-2.5 (3) MG/3ML IN SOLN
3.0000 mL | Freq: Once | RESPIRATORY_TRACT | Status: AC
  Administered 2024-12-12: 16:00:00 3 mL via RESPIRATORY_TRACT

## 2024-12-12 MED ORDER — OSELTAMIVIR PHOSPHATE 75 MG PO CAPS: 75.0000 mg | ORAL_CAPSULE | Freq: Two times a day (BID) | ORAL | 0 refills | Status: AC

## 2024-12-12 MED ORDER — PROMETHAZINE-DM 6.25-15 MG/5ML PO SYRP: 5.0000 mL | ORAL_SOLUTION | Freq: Four times a day (QID) | ORAL | 0 refills | Status: AC | PRN

## 2024-12-12 MED ORDER — ALBUTEROL SULFATE HFA 108 (90 BASE) MCG/ACT IN AERS: 2.0000 | INHALATION_SPRAY | RESPIRATORY_TRACT | 0 refills | Status: AC | PRN

## 2024-12-12 MED ORDER — DEXAMETHASONE SOD PHOSPHATE PF 10 MG/ML IJ SOLN
10.0000 mg | Freq: Once | INTRAMUSCULAR | Status: AC
  Administered 2024-12-12: 16:00:00 10 mg via INTRAMUSCULAR

## 2024-12-12 NOTE — ED Triage Notes (Signed)
 Pt reports cough, congestion, SOB, abdominal pain rib pain.x 2 days

## 2024-12-12 NOTE — Discharge Instructions (Signed)
 We have given you a steroid shot and a breathing treatment in clinic today.  Your chest x-ray was negative for pneumonia.  I have prescribed an inhaler, Tamiflu  and a cough syrup.  Follow-up for worsening or unresolving symptoms.  If possible, monitor your oxygen levels at home and go to the emergency department if you are not maintaining above 90%.

## 2024-12-14 NOTE — ED Provider Notes (Signed)
 " [facility]    CSN: [id number] Arrival date & time: 12/12/24  1420      History   Chief Complaint No chief complaint on file.   HPI Louis Griffin is a 63 y.o. male.   Patient presenting today with 2-day history of congestion, cough, body aches, shortness of breath, rib and chest wall soreness with coughing.  Unsure if he has had any fevers.  Denies vomiting, diarrhea, rashes, sore throat.  Recent exposure to influenza and multiple hospital exposures while being with his son who was recently hospitalized.  So far not trying anything over-the-counter for symptoms.    Past Medical History:  Diagnosis Date   Vertigo    Vitamin D deficiency     There are no active problems to display for this patient.   Past Surgical History:  Procedure Laterality Date   CERVICAL DISC SURGERY         Home Medications    Prior to Admission medications  Medication Sig Start Date End Date Taking? Authorizing Provider  albuterol  (VENTOLIN  HFA) 108 (90 Base) MCG/ACT inhaler Inhale 2 puffs into the lungs every 4 (four) hours as needed. 12/12/24  Yes Stuart Vernell Norris, PA-C  oseltamivir  (TAMIFLU ) 75 MG capsule Take 1 capsule (75 mg total) by mouth every 12 (twelve) hours. 12/12/24  Yes Stuart Vernell Norris, PA-C  promethazine -dextromethorphan (PROMETHAZINE -DM) 6.25-15 MG/5ML syrup Take 5 mLs by mouth 4 (four) times daily as needed. 12/12/24  Yes Stuart Vernell Norris, PA-C  aspirin 81 MG chewable tablet Chew by mouth daily.    [provider]  cholecalciferol (VITAMIN D3) 25 MCG (1000 UNIT) tablet Take 1,000 Units by mouth daily.    [provider]  cyclobenzaprine  (FLEXERIL ) 10 MG tablet Take 1 tablet (10 mg total) by mouth 2 (two) times daily as needed for muscle spasms. 06/25/24   Levander Houston, MD  ibuprofen  (ADVIL ) 600 MG tablet Take 1 tablet (600 mg total) by mouth every 6 (six) hours as needed. 06/25/24   Ray, Danielle, MD  ipratropium (ATROVENT ) 0.06  % nasal spray Place 2 sprays into both nostrils 4 (four) times daily. As needed for nasal congestion, runny nose 01/31/22   Joesph Shaver Scales, PA-C  meclizine  (ANTIVERT ) 25 MG tablet Take 1 tablet (25 mg total) by mouth 3 (three) times daily as needed for dizziness or nausea. Do not take with alcohol or while driving or operating heavy machinery.  May cause drowsiness. 02/08/23   Chandra Harlene LABOR, NP  oxyCODONE -acetaminophen  (PERCOCET/ROXICET) 5-325 MG tablet Take 1 tablet by mouth every 6 (six) hours as needed for severe pain (pain score 7-10). 06/25/24   Levander Houston, MD    Family History Family History  Problem Relation Age of Onset   Healthy Mother    Healthy Father     Social History Social History[1]   Allergies   Patient has no known allergies.   Review of Systems Review of Systems PER HPI  Physical Exam Triage Vital Signs ED Triage Vitals  Encounter Vitals Group     BP --      Girls Systolic BP Percentile --      Girls Diastolic BP Percentile --      Boys Systolic BP Percentile --      Boys Diastolic BP Percentile --      Pulse Rate 12/12/24 1503 92     Resp 12/12/24 1503 20     Temp 12/12/24 1503 99 F (37.2 C)     Temp  Source 12/12/24 1503 Oral     SpO2 12/12/24 1503 (!) 89 %     Weight --      Height --      Head Circumference --      Peak Flow --      Pain Score 12/12/24 1507 0     Pain Loc --      Pain Education --      Exclude from Growth Chart --    No data found.  Updated Vital Signs Pulse 92   Temp 99 F (37.2 C) (Oral)   Resp 20   SpO2 97% Comment: post neb  Visual Acuity Right Eye Distance:   Left Eye Distance:   Bilateral Distance:    Right Eye Near:   Left Eye Near:    Bilateral Near:     Physical Exam Vitals and nursing note reviewed.  Constitutional:      Appearance: He is well-developed.  HENT:     Head: Atraumatic.     Right Ear: External ear normal.     Left Ear: External ear normal.     Nose: Rhinorrhea present.      Mouth/Throat:     Mouth: Mucous membranes are moist.     Pharynx: Oropharynx is clear. No oropharyngeal exudate or posterior oropharyngeal erythema.  Eyes:     Conjunctiva/sclera: Conjunctivae normal.     Pupils: Pupils are equal, round, and reactive to light.  Cardiovascular:     Rate and Rhythm: Normal rate and regular rhythm.  Pulmonary:     Effort: Pulmonary effort is normal. No respiratory distress.     Breath sounds: No wheezing or rales.  Musculoskeletal:        General: Normal range of motion.     Cervical back: Normal range of motion and neck supple.  Lymphadenopathy:     Cervical: No cervical adenopathy.  Skin:    General: Skin is warm and dry.  Neurological:     Mental Status: He is alert and oriented to person, place, and time.  Psychiatric:        Behavior: Behavior normal.      UC Treatments / Results  Labs (all labs ordered are listed, but only abnormal results are displayed) Labs Reviewed  POC COVID19/FLU A&B COMBO - Abnormal; Notable for the following components:      Result Value   Influenza A Antigen, POC Positive (*)    All other components within normal limits    EKG   Radiology DG Chest 2 View Result Date: 12/12/2024 EXAM: 2 VIEW(S) XRAY OF THE CHEST 12/12/2024 03:40:35 PM COMPARISON: Comparison with 02/28/2011. CLINICAL HISTORY: cough, chest tightness x 2 days FINDINGS: LUNGS AND PLEURA: Lungs are clear. No pleural effusion. No pneumothorax. HEART AND MEDIASTINUM: Heart size and pulmonary vascularity are normal. Mediastinal contours appear intact. Calcification of the aorta. BONES AND SOFT TISSUES: Postoperative changes in the cervical spine. Degenerative changes in the thoracic spine. IMPRESSION: 1. No acute cardiopulmonary abnormality. 2. Aortic atherosclerosis. Electronically signed by: Elsie Gravely MD 12/12/2024 03:46 PM EST RP Workstation: [id number]    Procedures Procedures (including critical care time)  Medications Ordered in  UC Medications  ipratropium-albuterol  (DUONEB) 0.5-2.5 (3) MG/3ML nebulizer solution 3 mL (3 mLs Nebulization Given 12/12/24 1543)  dexamethasone  (DECADRON ) injection 10 mg (10 mg Intramuscular Given 12/12/24 1543)    Initial Impression / Assessment and Plan / UC Course  I have reviewed the triage vital signs and the nursing notes.  Pertinent labs & imaging  results that were available during my care of the patient were reviewed by me and considered in my medical decision making (see chart for details).     Patient borderline hypoxic throughout time in department today, ranging on room air from 87% to 92%.  Did increase to 97% directly after DuoNeb treatment but fell back to 90 to 91% at time of discharge.  He is breathing comfortably, speaking in full sentences and in no acute distress denying any shortness of breath.  Chest x-ray today showing no acute cardiopulmonary abnormality.  Flu and COVID test showing positive for influenza A.  Long discussion with patient regarding his oxygen saturations, he is comfortable monitoring his symptoms at home.  Wife plans to get a pulse oximeter to monitor him as well as his symptoms and they know to go to the emergency department for any worsening symptoms.  Treat with Tamiflu , Phenergan  DM, albuterol , IM Decadron .  Strict ED precautions again reviewed.  Patient and wife agreeable to plan.   1 hour spent today in direct patient care, evaluation, education  Final Clinical Impressions(s) / UC Diagnoses   Final diagnoses:  Acute cough  Influenza A  Hypoxia     Discharge Instructions      We have given you a steroid shot and a breathing treatment in clinic today.  Your chest x-ray was negative for pneumonia.  I have prescribed an inhaler, Tamiflu  and a cough syrup.  Follow-up for worsening or unresolving symptoms.  If possible, monitor your oxygen levels at home and go to the emergency department if you are not maintaining above 90%.    ED  Prescriptions     Medication Sig Dispense Auth. Provider   oseltamivir  (TAMIFLU ) 75 MG capsule Take 1 capsule (75 mg total) by mouth every 12 (twelve) hours. 10 capsule Stuart Vernell Norris, PA-C   promethazine -dextromethorphan (PROMETHAZINE -DM) 6.25-15 MG/5ML syrup Take 5 mLs by mouth 4 (four) times daily as needed. 100 mL Stuart Vernell Norris, PA-C   albuterol  (VENTOLIN  HFA) 108 (90 Base) MCG/ACT inhaler Inhale 2 puffs into the lungs every 4 (four) hours as needed. 18 g Stuart Vernell Norris, [place]      PDMP not reviewed this encounter.     [1]  Social History Tobacco Use   Smoking status: Former    Current packs/day: 0.00    Average packs/day: 0.5 packs/day    Types: Cigarettes    Quit date: 12/15/2022    Years since quitting: 2.0  Vaping Use   Vaping status: Never Used  Substance Use Topics   Alcohol use: Yes    Comment: 5-6 cans beer approx 5-6 days per week   Drug use: Never     Stuart Vernell Norris, PA-C 12/14/24 0927  "
